# Patient Record
Sex: Male | Born: 1965
Health system: Southern US, Community
[De-identification: ages and names within clinical notes are randomized; demographics above are authoritative.]

## PROBLEM LIST (undated history)

## (undated) DIAGNOSIS — K219 Gastro-esophageal reflux disease without esophagitis: Secondary | ICD-10-CM

## (undated) DIAGNOSIS — G709 Myoneural disorder, unspecified: Secondary | ICD-10-CM

## (undated) DIAGNOSIS — C024 Malignant neoplasm of lingual tonsil: Secondary | ICD-10-CM

## (undated) DIAGNOSIS — F419 Anxiety disorder, unspecified: Secondary | ICD-10-CM

## (undated) DIAGNOSIS — F329 Major depressive disorder, single episode, unspecified: Secondary | ICD-10-CM

## (undated) DIAGNOSIS — Z4659 Encounter for fitting and adjustment of other gastrointestinal appliance and device: Secondary | ICD-10-CM

## (undated) DIAGNOSIS — I10 Essential (primary) hypertension: Secondary | ICD-10-CM

## (undated) DIAGNOSIS — K838 Other specified diseases of biliary tract: Secondary | ICD-10-CM

## (undated) DIAGNOSIS — F32A Depression, unspecified: Secondary | ICD-10-CM

## (undated) DIAGNOSIS — K8050 Calculus of bile duct without cholangitis or cholecystitis without obstruction: Secondary | ICD-10-CM

## (undated) DIAGNOSIS — R17 Unspecified jaundice: Secondary | ICD-10-CM

## (undated) HISTORY — PX: KNEE SURGERY: SHX244

## (undated) HISTORY — DX: Anxiety disorder, unspecified: F41.9

## (undated) HISTORY — DX: Other specified diseases of biliary tract: K83.8

## (undated) HISTORY — DX: Calculus of bile duct without cholangitis or cholecystitis without obstruction: K80.50

## (undated) HISTORY — DX: Unspecified jaundice: R17

## (undated) HISTORY — DX: Myoneural disorder, unspecified: G70.9

## (undated) HISTORY — DX: Encounter for fitting and adjustment of other gastrointestinal appliance and device: Z46.59

---

## 2007-06-10 ENCOUNTER — Emergency Department: Payer: Self-pay | Admitting: Emergency Medicine

## 2009-12-13 ENCOUNTER — Ambulatory Visit: Payer: Self-pay | Admitting: Internal Medicine

## 2010-01-03 ENCOUNTER — Ambulatory Visit: Payer: Self-pay | Admitting: Otolaryngology

## 2010-01-08 ENCOUNTER — Ambulatory Visit: Payer: Self-pay | Admitting: Otolaryngology

## 2010-01-08 ENCOUNTER — Ambulatory Visit: Payer: Self-pay | Admitting: Internal Medicine

## 2010-01-11 ENCOUNTER — Ambulatory Visit: Payer: Self-pay | Admitting: Oncology

## 2010-01-12 ENCOUNTER — Ambulatory Visit: Payer: Self-pay | Admitting: Internal Medicine

## 2010-01-17 ENCOUNTER — Ambulatory Visit: Payer: Self-pay | Admitting: Oncology

## 2010-01-23 ENCOUNTER — Ambulatory Visit: Payer: Self-pay | Admitting: Vascular Surgery

## 2010-02-12 ENCOUNTER — Ambulatory Visit: Payer: Self-pay | Admitting: Oncology

## 2010-02-12 ENCOUNTER — Ambulatory Visit: Payer: Self-pay | Admitting: Internal Medicine

## 2010-03-15 ENCOUNTER — Ambulatory Visit: Payer: Self-pay | Admitting: Oncology

## 2010-03-15 ENCOUNTER — Ambulatory Visit: Payer: Self-pay | Admitting: Internal Medicine

## 2010-04-04 ENCOUNTER — Ambulatory Visit: Payer: Self-pay | Admitting: Radiation Oncology

## 2010-04-14 ENCOUNTER — Ambulatory Visit: Payer: Self-pay | Admitting: Oncology

## 2010-04-14 ENCOUNTER — Ambulatory Visit: Payer: Self-pay | Admitting: Internal Medicine

## 2010-05-15 ENCOUNTER — Ambulatory Visit: Payer: Self-pay | Admitting: Oncology

## 2010-05-15 ENCOUNTER — Ambulatory Visit: Payer: Self-pay | Admitting: Internal Medicine

## 2010-06-14 ENCOUNTER — Ambulatory Visit: Payer: Self-pay | Admitting: Oncology

## 2010-06-14 ENCOUNTER — Ambulatory Visit: Payer: Self-pay | Admitting: Internal Medicine

## 2010-07-15 ENCOUNTER — Ambulatory Visit: Payer: Self-pay | Admitting: Oncology

## 2010-07-15 ENCOUNTER — Ambulatory Visit: Payer: Self-pay | Admitting: Internal Medicine

## 2010-08-07 ENCOUNTER — Ambulatory Visit: Payer: Self-pay | Admitting: Oncology

## 2010-08-15 ENCOUNTER — Ambulatory Visit: Payer: Self-pay | Admitting: Internal Medicine

## 2010-08-15 ENCOUNTER — Ambulatory Visit: Payer: Self-pay | Admitting: Oncology

## 2010-09-13 ENCOUNTER — Ambulatory Visit: Payer: Self-pay | Admitting: Oncology

## 2010-10-14 ENCOUNTER — Ambulatory Visit: Payer: Self-pay | Admitting: Oncology

## 2010-11-13 ENCOUNTER — Ambulatory Visit: Payer: Self-pay | Admitting: Oncology

## 2010-12-14 ENCOUNTER — Ambulatory Visit: Payer: Self-pay | Admitting: Oncology

## 2011-01-13 ENCOUNTER — Ambulatory Visit: Payer: Self-pay | Admitting: Oncology

## 2011-02-04 ENCOUNTER — Ambulatory Visit: Payer: Self-pay | Admitting: Oncology

## 2011-02-13 ENCOUNTER — Ambulatory Visit: Payer: Self-pay | Admitting: Oncology

## 2011-03-16 ENCOUNTER — Ambulatory Visit: Payer: Self-pay | Admitting: Oncology

## 2011-04-15 ENCOUNTER — Ambulatory Visit: Payer: Self-pay | Admitting: Oncology

## 2011-05-16 ENCOUNTER — Ambulatory Visit: Payer: Self-pay | Admitting: Oncology

## 2011-06-15 ENCOUNTER — Ambulatory Visit: Payer: Self-pay | Admitting: Oncology

## 2011-07-16 ENCOUNTER — Ambulatory Visit: Payer: Self-pay | Admitting: Oncology

## 2011-08-16 ENCOUNTER — Ambulatory Visit: Payer: Self-pay | Admitting: Oncology

## 2011-09-16 ENCOUNTER — Ambulatory Visit: Payer: Self-pay | Admitting: Oncology

## 2011-10-14 ENCOUNTER — Ambulatory Visit: Payer: Self-pay | Admitting: Oncology

## 2011-11-05 ENCOUNTER — Ambulatory Visit: Payer: Self-pay | Admitting: Otolaryngology

## 2011-11-13 ENCOUNTER — Ambulatory Visit: Payer: Self-pay | Admitting: Oncology

## 2011-12-16 ENCOUNTER — Ambulatory Visit: Payer: Self-pay | Admitting: Oncology

## 2012-01-13 ENCOUNTER — Ambulatory Visit: Payer: Self-pay | Admitting: Oncology

## 2012-02-13 ENCOUNTER — Ambulatory Visit: Payer: Self-pay | Admitting: Oncology

## 2012-03-15 ENCOUNTER — Ambulatory Visit: Payer: Self-pay | Admitting: Oncology

## 2012-04-20 ENCOUNTER — Ambulatory Visit: Payer: Self-pay | Admitting: Oncology

## 2012-05-15 ENCOUNTER — Ambulatory Visit: Payer: Self-pay | Admitting: Oncology

## 2012-06-14 ENCOUNTER — Ambulatory Visit: Payer: Self-pay | Admitting: Oncology

## 2012-06-19 ENCOUNTER — Ambulatory Visit: Payer: Self-pay | Admitting: Vascular Surgery

## 2013-08-11 ENCOUNTER — Ambulatory Visit: Payer: Self-pay | Admitting: Otolaryngology

## 2013-08-17 LAB — PATHOLOGY REPORT

## 2014-11-01 NOTE — Op Note (Signed)
PATIENT NAME:  Roy Little, Roy Little MR#:  [id number] DATE OF BIRTH:  Aug 11, 1965  DATE OF PROCEDURE:  06/19/2012  PREOPERATIVE DIAGNOSIS: Squamous cell carcinoma of the tongue.   POSTOPERATIVE DIAGNOSIS: Squamous cell carcinoma of the tongue.   PROCEDURE PERFORMED: Removal right IJ Infuse-a-Port.   SURGEON: Hortencia Pilar, MD   SEDATION: Versed 7 mg plus fentanyl 300 mcg administered IV. Continuous ECG, pulse oximetry and cardiopulmonary monitoring was performed throughout the entire procedure by the Interventional Radiology nurse. Total sedation time was 25 minutes.   ACCESS: Right IJ.   FLUOROSCOPY TIME:  None.   CONTRAST: None.   INDICATIONS: Mr. Jodoin has completed chemotherapy and is now having his port removed. The risks and benefits were reviewed. All questions were answered. The patient agrees to proceed.   DESCRIPTION OF PROCEDURE: The patient is taken to Special Procedures and placed in the supine position. After adequate sedation is achieved, 1% lidocaine was infiltrated in the soft tissues. A previous incisional scar is reopened with an 11 blade and the port is removed. Pressure is held for five minutes, and subsequently the subcutaneous tissues are reapproximated with interrupted 3-0 Vicryl and the skin is closed with 4-0 Monocryl subcuticular and Dermabond. The patient tolerated the procedure well. There were no immediate complications.  ____________________________ Katha Cabal, MD ggs:cbb D: 06/19/2012 14:43:12 ET T: 06/19/2012 15:10:53 ET JOB#: [id number] Katha Cabal MD ELECTRONICALLY SIGNED 07/01/2012 16:23

## 2014-11-05 NOTE — Op Note (Signed)
PATIENT NAME:  Roy Little, Roy Little MR#:  [id number] DATE OF BIRTH:  1966-01-04  DATE OF PROCEDURE:  08/11/2013  PREOPERATIVE  DIAGNOSES: 1.  Left laryngeal granuloma.  2.  Neoplasm left hypopharynx, posterior wall.  POSTOPERATIVE DIAGNOSES: 1.  Left laryngeal granuloma.  2.  Neoplasm left hypopharynx, posterior wall.  PROCEDURE: 1.  Microlaryngoscopy with excision of left laryngeal granuloma and Kenalog injection.  2.  CO2 laser excision of neoplasm involving the left hypopharyngeal posterior pharyngeal wall adjacent to the tongue base.   SURGEON: Malon Kindle, MD.   ANESTHESIA: General endotracheal.   INDICATIONS: This is a 49 year old male with a history of left laryngeal granuloma as well as squamous cell carcinoma involving the left tongue base and hypopharynx. He has a new lesion near the tongue base on the left side as well as recurrence of his laryngeal granuloma.   FINDINGS: There was a granuloma involving the left arytenoid and posterior vocal cord region. There was an approximately 5 to 6 mm exophytic neoplasm involving the left posterior pharyngeal wall and the hypopharynx just adjacent to the base of tongue. This lesion appeared quite superficial, was quite mobile to digital palpation.  It did not appear to infiltrate deeply.   COMPLICATIONS: None.   DESCRIPTION OF PROCEDURE: After obtaining informed consent, the patient was taken to the operating room and placed in the supine position. After induction of general endotracheal anesthesia with a laser laryngeal tube, the patient was turned 90 degrees and placed in a shoulder roll. A tooth guard was used to protect the upper teeth. The eyes were protected with saline moistened gauze pads and saline moistened towels draped around the patient's face. A Dedo laryngoscope was used to evaluate the hypopharynx, larynx and tongue base with the above findings. The laryngoscope was placed into position in the larynx visualizing the arytenoid  granuloma. The patient was placed into suspension. The lesion was inspected with a 0 degrees scope and photodocumentation obtained. The microscope was then moved into position for better visualization. The laryngeal lesion was biopsied away with cup forceps down to its base. This tissue was sent for pathology. The base of the lesion was then injected with 40 mg/mL Kenalog. Less than 0.25 mL was injected into the base of the lesion. Minor bleeding was controlled with Afrin moistened pledgets. The patient was then taken out of suspension and the scope removed to visualize the lesion in the hypopharynx on the left posterior pharyngeal wall. Bimanual palpation of the lesion revealed it was very superficial and mobile with no evidence of any deep extension. It was about 5 to 6 mm in greatest dimension and exophytic with scant exudate on its surface. It had a very friable appearance. The patient was again placed into suspension with this lesion identified. The CO2 laser was then used on a setting of 4 watts continuous to outline a margin of approximately 4 mm around the entire lesion. With the mucosa cut using the laser, the image was grasped with forceps and the lesion dissected predominantly with use of the laser, although some cold dissection was used with microlaryngeal scissors. The lesion pulled easily away from the underlying muscle. There did not appear to be any deep infiltration. Minor bleeding was controlled with Afrin moistened pledgets and spot welding with the laser. The laryngoscope was removed and the patient returned to the anesthesiologist for awakening. He was awakened and taken to the recovery room in good condition postoperatively. Blood loss was less than 10 mL.   ____________________________  Sammuel Hines. Richardson Landry, MD psb:dp D: 08/11/2013 11:45:38 ET T: 08/11/2013 12:03:06 ET JOB#: [id number]  cc: Sammuel Hines. Richardson Landry, MD, <Dictator> Riley Nearing MD ELECTRONICALLY SIGNED 08/18/2013 16:55

## 2016-11-25 DIAGNOSIS — K219 Gastro-esophageal reflux disease without esophagitis: Secondary | ICD-10-CM | POA: Diagnosis not present

## 2016-11-25 DIAGNOSIS — G4700 Insomnia, unspecified: Secondary | ICD-10-CM | POA: Diagnosis not present

## 2017-06-05 ENCOUNTER — Observation Stay
Admission: EM | Admit: 2017-06-05 | Discharge: 2017-06-08 | Disposition: A | Discharge status: Home / Self Care | Payer: 59 | Attending: Surgery | Admitting: Surgery

## 2017-06-05 ENCOUNTER — Emergency Department: Payer: 59

## 2017-06-05 ENCOUNTER — Encounter: Payer: Self-pay | Admitting: Emergency Medicine

## 2017-06-05 DIAGNOSIS — K819 Cholecystitis, unspecified: Secondary | ICD-10-CM

## 2017-06-05 DIAGNOSIS — Z79899 Other long term (current) drug therapy: Secondary | ICD-10-CM | POA: Diagnosis not present

## 2017-06-05 DIAGNOSIS — J9811 Atelectasis: Secondary | ICD-10-CM | POA: Diagnosis not present

## 2017-06-05 DIAGNOSIS — I10 Essential (primary) hypertension: Secondary | ICD-10-CM | POA: Diagnosis not present

## 2017-06-05 DIAGNOSIS — Z419 Encounter for procedure for purposes other than remedying health state, unspecified: Secondary | ICD-10-CM

## 2017-06-05 DIAGNOSIS — R1013 Epigastric pain: Secondary | ICD-10-CM

## 2017-06-05 DIAGNOSIS — K811 Chronic cholecystitis: Principal | ICD-10-CM | POA: Insufficient documentation

## 2017-06-05 DIAGNOSIS — R1084 Generalized abdominal pain: Secondary | ICD-10-CM | POA: Diagnosis not present

## 2017-06-05 DIAGNOSIS — Z85810 Personal history of malignant neoplasm of tongue: Secondary | ICD-10-CM | POA: Diagnosis not present

## 2017-06-05 DIAGNOSIS — K8000 Calculus of gallbladder with acute cholecystitis without obstruction: Secondary | ICD-10-CM | POA: Diagnosis present

## 2017-06-05 DIAGNOSIS — R1110 Vomiting, unspecified: Secondary | ICD-10-CM | POA: Diagnosis not present

## 2017-06-05 HISTORY — DX: Malignant neoplasm of lingual tonsil: C02.4

## 2017-06-05 LAB — CBC
HCT: 50.5 % (ref 40.0–52.0)
Hemoglobin: 17.2 g/dL (ref 13.0–18.0)
MCH: 29.9 pg (ref 26.0–34.0)
MCHC: 34.1 g/dL (ref 32.0–36.0)
MCV: 87.5 fL (ref 80.0–100.0)
Platelets: 368 K/uL (ref 150–440)
RBC: 5.77 MIL/uL (ref 4.40–5.90)
RDW: 13.5 % (ref 11.5–14.5)
WBC: 17.3 K/uL — ABNORMAL HIGH (ref 3.8–10.6)

## 2017-06-05 LAB — LIPASE, BLOOD: Lipase: 31 U/L (ref 11–51)

## 2017-06-05 LAB — COMPREHENSIVE METABOLIC PANEL WITH GFR
ALT: 100 U/L — ABNORMAL HIGH (ref 17–63)
AST: 134 U/L — ABNORMAL HIGH (ref 15–41)
Albumin: 4.8 g/dL (ref 3.5–5.0)
Alkaline Phosphatase: 131 U/L — ABNORMAL HIGH (ref 38–126)
Anion gap: 12 (ref 5–15)
BUN: 17 mg/dL (ref 6–20)
CO2: 27 mmol/L (ref 22–32)
Calcium: 9.1 mg/dL (ref 8.9–10.3)
Chloride: 101 mmol/L (ref 101–111)
Creatinine, Ser: 0.91 mg/dL (ref 0.61–1.24)
GFR calc Af Amer: >60 mL/min
GFR calc non Af Amer: >60 mL/min
Glucose, Bld: 143 mg/dL — ABNORMAL HIGH (ref 65–99)
Potassium: 3.5 mmol/L (ref 3.5–5.1)
Sodium: 140 mmol/L (ref 135–145)
Total Bilirubin: 1.2 mg/dL (ref 0.3–1.2)
Total Protein: 7.9 g/dL (ref 6.5–8.1)

## 2017-06-05 MED ORDER — MORPHINE SULFATE (PF) 4 MG/ML IV SOLN
INTRAVENOUS | Status: AC
  Filled 2017-06-05: qty 1

## 2017-06-05 MED ORDER — IOPAMIDOL (ISOVUE-300) INJECTION 61%
30.0000 mL | Freq: Once | INTRAVENOUS | Status: AC | PRN
  Administered 2017-06-06: 30 mL via ORAL

## 2017-06-05 MED ORDER — MORPHINE SULFATE (PF) 4 MG/ML IV SOLN
INTRAVENOUS | Status: AC
  Administered 2017-06-05: 4 mg via INTRAVENOUS
  Filled 2017-06-05: qty 1

## 2017-06-05 MED ORDER — SODIUM CHLORIDE 0.9 % IV BOLUS (SEPSIS)
1000.0000 mL | Freq: Once | INTRAVENOUS | Status: AC
  Administered 2017-06-05: 1000 mL via INTRAVENOUS

## 2017-06-05 MED ORDER — MORPHINE SULFATE (PF) 4 MG/ML IV SOLN
4.0000 mg | Freq: Once | INTRAVENOUS | Status: AC
  Administered 2017-06-05: 4 mg via INTRAVENOUS

## 2017-06-05 MED ORDER — ONDANSETRON HCL 4 MG/2ML IJ SOLN
4.0000 mg | Freq: Once | INTRAMUSCULAR | Status: AC | PRN
  Administered 2017-06-05: 4 mg via INTRAVENOUS
  Filled 2017-06-05: qty 2

## 2017-06-05 NOTE — ED Triage Notes (Signed)
Pt arrived with family with complaints of severe abdominal pain that started at 1700 today. Pt reports 6 episodes of vomiting. Pt in extreme pain upon triage and is short of breath.Pt states pain is in his upper abdomen.

## 2017-06-05 NOTE — ED Provider Notes (Signed)
[facility] Emergency Department Provider Note ____________________________________________   First MD Initiated Contact with Patient 06/05/17 2056     (approximate)  I have reviewed the triage vital signs and the nursing notes.   HISTORY  Chief Complaint Abdominal Pain    HPI Roy Little is a 51 y.o. male with a history of lingual tonsil malignancy which is in remission, who presents with epigastric and bilateral upper abdominal pain, acute onset approximately 4 hours ago, occurring approximately 2 hours after he ate Thanksgiving dinner, constant, and associated with approximately 6 episodes of nonbloody vomiting.   Patient denies any prior history of similar pain.  He has no history of any abdominal surgeries.  He states his last bowel movement was earlier today prior to eating and it was normal.  No sick contacts or other recent illness.  Denies any specific chest or back pain.  No shortness of breath, but he states that the abdominal pain is worse when he takes deep breath.  Past Medical History:  Diagnosis Date  . 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[place])     There are no active problems to display for this patient.   History reviewed. No pertinent surgical history.  Prior to Admission medications   Not on File    Allergies Patient has no known allergies.  No family history on file.  Social History Social History   Tobacco Use  . Smoking status: Never Smoker  . Smokeless tobacco: Never Used  Substance Use Topics  . Alcohol use: Yes  . Drug use: No    Review of Systems  Constitutional: No fever/chills. Eyes: No numbness. ENT: No sore throat. Cardiovascular: Denies chest pain. Respiratory: Denies shortness of breath. Gastrointestinal: Positive for nausea, vomiting, and abdominal pain..  Genitourinary: Negative for dysuria.  Musculoskeletal: Negative for back pain. Skin: Negative for rash. Neurological: Negative for  headache.   ____________________________________________   PHYSICAL EXAM:  VITAL SIGNS: ED Triage Vitals  Enc Vitals Group     BP 06/05/17 2052 (!) 205/127     Pulse Rate 06/05/17 2052 (!) 130     Resp 06/05/17 2052 (!) 25     Temp 06/05/17 2052 98.1 F (36.7 C)     Temp Source 06/05/17 2052 Oral     SpO2 06/05/17 2052 97 %     Weight 06/05/17 2049 195 lb (88.5 kg)     Height 06/05/17 2049 5\' 9"  (1.753 m)     Head Circumference --      Peak Flow --      Pain Score 06/05/17 2049 10     Pain Loc --      Pain Edu? --      Excl. in [place]? --     Constitutional: Alert and oriented.  Uncomfortable appearing and slightly diaphoretic. Eyes: Conjunctivae are normal.  No scleral icterus. Head: Atraumatic. Nose: No congestion/rhinnorhea. Mouth/Throat: Mucous membranes are moist.   Neck: Normal range of motion.  Cardiovascular: Tachycardic, regular rhythm. Grossly normal heart sounds.  Good peripheral circulation. Respiratory: Normal respiratory effort.  No retractions. Lungs CTAB. Gastrointestinal: Soft with mild bilateral upper abdominal tenderness.  Periumbilical area and lower abdomen are nontender.  No peritoneal signs. Genitourinary: No CVA tenderness. Musculoskeletal: No lower extremity edema.  Extremities warm and well perfused.  Neurologic:  Normal speech and language. No gross focal neurologic deficits are appreciated.  Skin:  Skin is warm and dry. No rash noted. Psychiatric: Mood and affect are normal. Speech and behavior are normal.  ____________________________________________   LABS (all labs ordered are listed, but only abnormal results are displayed)  Labs Reviewed  COMPREHENSIVE METABOLIC PANEL - Abnormal; Notable for the following components:      Result Value   Glucose, Bld 143 (*)    AST 134 (*)    ALT 100 (*)    Alkaline Phosphatase 131 (*)    All other components within normal limits  CBC - Abnormal; Notable for the following components:   WBC 17.3 (*)     All other components within normal limits  LIPASE, BLOOD  URINALYSIS, COMPLETE (UACMP) WITH MICROSCOPIC  TROPONIN I   ____________________________________________  EKG  ED ECG REPORT I, Arta Silence, the attending physician, personally viewed and interpreted this ECG.  Date: 06/05/2017 EKG Time: 2050 Rate: 133 Rhythm: Sinus tachycardia QRS Axis: normal Intervals: normal ST/T Wave abnormalities: normal Narrative Interpretation: Sinus tachycardia with no evidence of acute ischemia  ____________________________________________  RADIOLOGY  CXR: no free air [place] abd: no gallstones or evidence of cholecystitis.   CT abd: pending ____________________________________________   PROCEDURES  Procedure(s) performed: No    Critical Care performed: No ____________________________________________   INITIAL IMPRESSION / ASSESSMENT AND PLAN / ED COURSE  Pertinent labs & imaging results that were available during my care of the patient were reviewed by me and considered in my medical decision making (see chart for details).  51 year old male with a history of a lingual cancer but no other significant medical problems and no prior abdominal surgeries presents with severe acute onset epigastric abdominal pain within a few hours after eating Thanksgiving meal.  Is associated with multiple episodes of vomiting, but no other acute symptoms.  Review of past medical records in Epic is noncontributory.  On exam, patient is extremely uncomfortable appearing, and he is tachycardic and hypertensive; I suspect that these vital sign abnormalities are most likely related to acute pain.  He has tenderness to bilateral upper quadrants in the epigastric area, but the remainder of the abdomen is nontender.  Otherwise his exam is unremarkable.  Differential includes biliary colic or acute cholecystitis, pancreatitis (although less likely because patient denies alcohol use), gastritis, PUD with  possible perforation, less likely other hepatobiliary cause, or colitis.  Consider small bowel obstruction or volvulus, however patient has no specific risk factors for this.  Although patient is tachycardic and hypertensive, given the location of the pain entirely the abdomen and not going to the chest or back, as well as his lack of vascular risk factors and normal EKG, I have a low suspicion for aortic dissection or other acute vascular cause at this time.  Plan: Analgesia, fluids, basic and hepatobiliary labs, and chest x-ray to rule out free air.  We will then proceed to right upper quadrant ultrasound.  If this is negative I will obtain a CT of the abdomen.    ----------------------------------------- 12:15 AM on 06/06/2017 -----------------------------------------  Patient's lab workup is unremarkable except for elevated WBC count which is nonspecific.  Chest x-ray did not reveal free air, and the ultrasound did not reveal gallstones or evidence of cholecystitis.  Patient reports significantly improved pain, and appears much more comfortable.  On my reassessment his systolic blood pressure was in the 170s and his heart rate was improved as well.  Given patient's improved appearance, improved vital signs, and the negative workup so far, I still do not have a significant clinical suspicion for vascular etiology.  As planned initially, we will proceed with a CT abdomen to evaluate for  colitis, obstruction, or other acute causes.  Patient signed out to oncoming provider Dr. Dahlia Client.  If his vital signs normalized, he remains comfortable, and the CT is negative for acute findings, he may be discharged home.  I explained the plan to the patient and he expressed understanding.  ____________________________________________   FINAL CLINICAL IMPRESSION(S) / ED DIAGNOSES  Final diagnoses:  Epigastric abdominal pain      NEW MEDICATIONS STARTED DURING THIS VISIT:  This SmartLink is deprecated.  Use AVSMEDLIST instead to display the medication list for a patient.   Note:  This document was prepared using Dragon voice recognition software and may include unintentional dictation errors.    Arta Silence, MD 06/06/17 [phone]

## 2017-06-05 NOTE — ED Notes (Signed)
First Nurse Note: Pt here with complaints of vomiting. Pt appears pale.

## 2017-06-06 ENCOUNTER — Observation Stay: Payer: 59

## 2017-06-06 ENCOUNTER — Observation Stay: Payer: 59 | Admitting: Certified Registered Nurse Anesthetist

## 2017-06-06 ENCOUNTER — Other Ambulatory Visit: Payer: Self-pay

## 2017-06-06 ENCOUNTER — Encounter
Admission: EM | Disposition: A | Discharge status: Home / Self Care | Payer: Self-pay | Source: Home / Self Care | Attending: Emergency Medicine

## 2017-06-06 ENCOUNTER — Emergency Department: Payer: 59

## 2017-06-06 DIAGNOSIS — K811 Chronic cholecystitis: Secondary | ICD-10-CM | POA: Diagnosis not present

## 2017-06-06 DIAGNOSIS — K810 Acute cholecystitis: Secondary | ICD-10-CM | POA: Diagnosis not present

## 2017-06-06 DIAGNOSIS — K8000 Calculus of gallbladder with acute cholecystitis without obstruction: Secondary | ICD-10-CM | POA: Diagnosis not present

## 2017-06-06 DIAGNOSIS — Z79899 Other long term (current) drug therapy: Secondary | ICD-10-CM | POA: Diagnosis not present

## 2017-06-06 DIAGNOSIS — R945 Abnormal results of liver function studies: Secondary | ICD-10-CM | POA: Diagnosis not present

## 2017-06-06 DIAGNOSIS — I10 Essential (primary) hypertension: Secondary | ICD-10-CM | POA: Diagnosis not present

## 2017-06-06 DIAGNOSIS — K8020 Calculus of gallbladder without cholecystitis without obstruction: Secondary | ICD-10-CM | POA: Diagnosis not present

## 2017-06-06 DIAGNOSIS — R1110 Vomiting, unspecified: Secondary | ICD-10-CM | POA: Diagnosis not present

## 2017-06-06 DIAGNOSIS — K819 Cholecystitis, unspecified: Secondary | ICD-10-CM | POA: Diagnosis not present

## 2017-06-06 HISTORY — PX: CHOLECYSTECTOMY: SHX55

## 2017-06-06 HISTORY — DX: Calculus of gallbladder with acute cholecystitis without obstruction: K80.00

## 2017-06-06 LAB — URINALYSIS, COMPLETE (UACMP) WITH MICROSCOPIC
Bacteria, UA: NONE SEEN
Bilirubin Urine: NEGATIVE
GLUCOSE, UA: NEGATIVE mg/dL
HGB URINE DIPSTICK: NEGATIVE
KETONES UR: NEGATIVE mg/dL
Leukocytes, UA: NEGATIVE
NITRITE: NEGATIVE
PH: 7 (ref 5.0–8.0)
Protein, ur: 30 mg/dL — AB
Specific Gravity, Urine: 1.011 (ref 1.005–1.030)
Squamous Epithelial / LPF: NONE SEEN

## 2017-06-06 LAB — LACTIC ACID, PLASMA
Lactic Acid, Venous: 2.2 mmol/L (ref 0.5–1.9)
Lactic Acid, Venous: 1.7 mmol/L (ref 0.5–1.9)

## 2017-06-06 LAB — TROPONIN I: Troponin I: <0.03 ng/mL (ref <0.03)

## 2017-06-06 SURGERY — LAPAROSCOPIC CHOLECYSTECTOMY WITH INTRAOPERATIVE CHOLANGIOGRAM
Anesthesia: General | Site: Abdomen | Wound class: Clean

## 2017-06-06 MED ORDER — MORPHINE SULFATE (PF) 2 MG/ML IV SOLN
2.0000 mg | INTRAVENOUS | Status: DC | PRN
  Administered 2017-06-06: 2 mg via INTRAVENOUS
  Filled 2017-06-06: qty 1

## 2017-06-06 MED ORDER — BUPIVACAINE-EPINEPHRINE (PF) 0.25% -1:200000 IJ SOLN
INTRAMUSCULAR | Status: AC
  Filled 2017-06-06: qty 30

## 2017-06-06 MED ORDER — ACETAMINOPHEN 650 MG RE SUPP: 650.0000 mg | Freq: Four times a day (QID) | RECTAL | Status: DC | PRN

## 2017-06-06 MED ORDER — PANTOPRAZOLE SODIUM 40 MG IV SOLR
40.0000 mg | Freq: Every day | INTRAVENOUS | Status: DC
  Administered 2017-06-06 – 2017-06-07 (×2): 40 mg via INTRAVENOUS
  Filled 2017-06-06 (×2): qty 40

## 2017-06-06 MED ORDER — PHENYLEPHRINE HCL 10 MG/ML IJ SOLN
INTRAMUSCULAR | Status: DC | PRN
  Administered 2017-06-06: 200 ug via INTRAVENOUS
  Administered 2017-06-06 (×2): 150 ug via INTRAVENOUS
  Administered 2017-06-06 (×3): 100 ug via INTRAVENOUS
  Administered 2017-06-06: 150 ug via INTRAVENOUS

## 2017-06-06 MED ORDER — ACETAMINOPHEN 10 MG/ML IV SOLN
INTRAVENOUS | Status: AC
  Filled 2017-06-06: qty 100

## 2017-06-06 MED ORDER — FENTANYL CITRATE (PF) 100 MCG/2ML IJ SOLN
25.0000 ug | INTRAMUSCULAR | Status: DC | PRN
  Administered 2017-06-06: 25 ug via INTRAVENOUS

## 2017-06-06 MED ORDER — METOPROLOL TARTRATE 25 MG PO TABS
25.0000 mg | ORAL_TABLET | Freq: Two times a day (BID) | ORAL | Status: DC
  Administered 2017-06-06 – 2017-06-08 (×4): 25 mg via ORAL
  Filled 2017-06-06 (×4): qty 1

## 2017-06-06 MED ORDER — DEXAMETHASONE SODIUM PHOSPHATE 10 MG/ML IJ SOLN
INTRAMUSCULAR | Status: DC | PRN
  Administered 2017-06-06: 10 mg via INTRAVENOUS

## 2017-06-06 MED ORDER — VANCOMYCIN HCL IN DEXTROSE 1-5 GM/200ML-% IV SOLN
1000.0000 mg | Freq: Three times a day (TID) | INTRAVENOUS | Status: DC
  Filled 2017-06-06 (×2): qty 200

## 2017-06-06 MED ORDER — FENTANYL CITRATE (PF) 100 MCG/2ML IJ SOLN
INTRAMUSCULAR | Status: AC
  Filled 2017-06-06: qty 2

## 2017-06-06 MED ORDER — LABETALOL HCL 5 MG/ML IV SOLN: 10.0000 mg | Freq: Once | INTRAVENOUS | Status: DC

## 2017-06-06 MED ORDER — PROPOFOL 10 MG/ML IV BOLUS
INTRAVENOUS | Status: AC
  Filled 2017-06-06: qty 20

## 2017-06-06 MED ORDER — ONDANSETRON HCL 4 MG/2ML IJ SOLN: 4.0000 mg | Freq: Once | INTRAMUSCULAR | Status: DC | PRN

## 2017-06-06 MED ORDER — IOPAMIDOL (ISOVUE-300) INJECTION 61%
100.0000 mL | Freq: Once | INTRAVENOUS | Status: AC | PRN
  Administered 2017-06-06: 100 mL via INTRAVENOUS

## 2017-06-06 MED ORDER — ONDANSETRON HCL 4 MG/2ML IJ SOLN
INTRAMUSCULAR | Status: AC
  Filled 2017-06-06: qty 2

## 2017-06-06 MED ORDER — VANCOMYCIN HCL IN DEXTROSE 1-5 GM/200ML-% IV SOLN
1000.0000 mg | Freq: Once | INTRAVENOUS | Status: AC
  Administered 2017-06-06: 1000 mg via INTRAVENOUS
  Filled 2017-06-06: qty 200

## 2017-06-06 MED ORDER — DIPHENHYDRAMINE HCL 50 MG/ML IJ SOLN
INTRAMUSCULAR | Status: DC | PRN
  Administered 2017-06-06: 12.5 mg via INTRAVENOUS

## 2017-06-06 MED ORDER — DEXTROSE-NACL 5-0.45 % IV SOLN
INTRAVENOUS | Status: DC
Start: 2017-06-06 — End: 2017-06-07
  Administered 2017-06-06 – 2017-06-07 (×3): via INTRAVENOUS

## 2017-06-06 MED ORDER — HYDROCODONE-ACETAMINOPHEN 5-325 MG PO TABS
1.0000 | ORAL_TABLET | ORAL | Status: DC | PRN
  Administered 2017-06-06: 1 via ORAL
  Administered 2017-06-07 – 2017-06-08 (×3): 2 via ORAL
  Filled 2017-06-06 (×3): qty 2
  Filled 2017-06-06: qty 1

## 2017-06-06 MED ORDER — PROPOFOL 10 MG/ML IV BOLUS
INTRAVENOUS | Status: DC | PRN
  Administered 2017-06-06: 30 mg via INTRAVENOUS
  Administered 2017-06-06: 170 mg via INTRAVENOUS

## 2017-06-06 MED ORDER — EPHEDRINE SULFATE 50 MG/ML IJ SOLN
INTRAMUSCULAR | Status: DC | PRN
  Administered 2017-06-06: 5 mg via INTRAVENOUS
  Administered 2017-06-06: 10 mg via INTRAVENOUS

## 2017-06-06 MED ORDER — ROCURONIUM BROMIDE 100 MG/10ML IV SOLN
INTRAVENOUS | Status: DC | PRN
  Administered 2017-06-06: 20 mg via INTRAVENOUS
  Administered 2017-06-06: 30 mg via INTRAVENOUS
  Administered 2017-06-06: 10 mg via INTRAVENOUS

## 2017-06-06 MED ORDER — SUCCINYLCHOLINE CHLORIDE 20 MG/ML IJ SOLN
INTRAMUSCULAR | Status: DC | PRN
  Administered 2017-06-06: 120 mg via INTRAVENOUS

## 2017-06-06 MED ORDER — SODIUM CHLORIDE 0.9 % IV BOLUS (SEPSIS): 1000.0000 mL | Freq: Once | INTRAVENOUS | Status: DC

## 2017-06-06 MED ORDER — FENTANYL CITRATE (PF) 100 MCG/2ML IJ SOLN
INTRAMUSCULAR | Status: DC | PRN
  Administered 2017-06-06 (×2): 50 ug via INTRAVENOUS

## 2017-06-06 MED ORDER — ACETAMINOPHEN 325 MG PO TABS: 650.0000 mg | ORAL_TABLET | Freq: Four times a day (QID) | ORAL | Status: DC | PRN

## 2017-06-06 MED ORDER — LIDOCAINE HCL (CARDIAC) 20 MG/ML IV SOLN
INTRAVENOUS | Status: DC | PRN
Start: 2017-06-06 — End: 2017-06-06
  Administered 2017-06-06: 100 mg via INTRAVENOUS

## 2017-06-06 MED ORDER — SUGAMMADEX SODIUM 200 MG/2ML IV SOLN
INTRAVENOUS | Status: DC | PRN
  Administered 2017-06-06: 174.4 mg via INTRAVENOUS

## 2017-06-06 MED ORDER — PROMETHAZINE HCL 25 MG/ML IJ SOLN
12.5000 mg | Freq: Four times a day (QID) | INTRAMUSCULAR | Status: DC | PRN
  Administered 2017-06-06: 12.5 mg via INTRAVENOUS
  Filled 2017-06-06: qty 1

## 2017-06-06 MED ORDER — DEXAMETHASONE SODIUM PHOSPHATE 10 MG/ML IJ SOLN
INTRAMUSCULAR | Status: AC
  Filled 2017-06-06: qty 1

## 2017-06-06 MED ORDER — SUGAMMADEX SODIUM 200 MG/2ML IV SOLN
INTRAVENOUS | Status: AC
  Filled 2017-06-06: qty 2

## 2017-06-06 MED ORDER — HYDRALAZINE HCL 20 MG/ML IJ SOLN
10.0000 mg | INTRAMUSCULAR | Status: DC | PRN
  Administered 2017-06-06 – 2017-06-07 (×3): 10 mg via INTRAVENOUS
  Filled 2017-06-06 (×4): qty 1

## 2017-06-06 MED ORDER — LABETALOL HCL 5 MG/ML IV SOLN
10.0000 mg | Freq: Four times a day (QID) | INTRAVENOUS | Status: DC
  Administered 2017-06-06: 10 mg via INTRAVENOUS
  Filled 2017-06-06: qty 4

## 2017-06-06 MED ORDER — ENALAPRILAT 1.25 MG/ML IV SOLN: 1.2500 mg | Freq: Three times a day (TID) | INTRAVENOUS | Status: DC

## 2017-06-06 MED ORDER — LABETALOL HCL 5 MG/ML IV SOLN
20.0000 mg | Freq: Once | INTRAVENOUS | Status: AC
  Administered 2017-06-06: 20 mg via INTRAVENOUS
  Filled 2017-06-06: qty 4

## 2017-06-06 MED ORDER — ONDANSETRON HCL 4 MG/2ML IJ SOLN
4.0000 mg | Freq: Four times a day (QID) | INTRAMUSCULAR | Status: DC
  Administered 2017-06-06 – 2017-06-08 (×5): 4 mg via INTRAVENOUS
  Filled 2017-06-06 (×4): qty 2

## 2017-06-06 MED ORDER — ONDANSETRON HCL 4 MG/2ML IJ SOLN
4.0000 mg | Freq: Once | INTRAMUSCULAR | Status: AC | PRN
  Administered 2017-06-06: 4 mg via INTRAVENOUS
  Filled 2017-06-06: qty 2

## 2017-06-06 MED ORDER — CEFTRIAXONE SODIUM 2 G IJ SOLR: 2.0000 g | INTRAMUSCULAR | Status: DC

## 2017-06-06 MED ORDER — KETOROLAC TROMETHAMINE 30 MG/ML IJ SOLN
INTRAMUSCULAR | Status: DC | PRN
  Administered 2017-06-06: 30 mg via INTRAVENOUS

## 2017-06-06 MED ORDER — KETOROLAC TROMETHAMINE 30 MG/ML IJ SOLN
INTRAMUSCULAR | Status: AC
  Filled 2017-06-06: qty 1

## 2017-06-06 MED ORDER — ACETAMINOPHEN 10 MG/ML IV SOLN
INTRAVENOUS | Status: DC | PRN
  Administered 2017-06-06: 1000 mg via INTRAVENOUS

## 2017-06-06 MED ORDER — SODIUM CHLORIDE 0.9 % IV BOLUS (SEPSIS)
1000.0000 mL | Freq: Once | INTRAVENOUS | Status: AC
  Administered 2017-06-06: 1000 mL via INTRAVENOUS

## 2017-06-06 MED ORDER — MORPHINE SULFATE (PF) 2 MG/ML IV SOLN: 2.0000 mg | INTRAVENOUS | Status: DC | PRN

## 2017-06-06 MED ORDER — MIDAZOLAM HCL 2 MG/2ML IJ SOLN
INTRAMUSCULAR | Status: AC
  Filled 2017-06-06: qty 2

## 2017-06-06 MED ORDER — MIDAZOLAM HCL 2 MG/2ML IJ SOLN
INTRAMUSCULAR | Status: DC | PRN
  Administered 2017-06-06: 2 mg via INTRAVENOUS

## 2017-06-06 MED ORDER — LACTATED RINGERS IV SOLN
INTRAVENOUS | Status: DC | PRN
  Administered 2017-06-06: 15:00:00 via INTRAVENOUS

## 2017-06-06 MED ORDER — PIPERACILLIN-TAZOBACTAM 3.375 G IVPB
3.3750 g | Freq: Three times a day (TID) | INTRAVENOUS | Status: DC
  Administered 2017-06-06 – 2017-06-08 (×7): 3.375 g via INTRAVENOUS
  Filled 2017-06-06 (×7): qty 50

## 2017-06-06 SURGICAL SUPPLY — 47 items
APPLICATOR COTTON TIP 6IN STRL (MISCELLANEOUS) ×2 IMPLANT
APPLIER CLIP 5 13 M/L LIGAMAX5 (MISCELLANEOUS) ×2
BLADE SURG 15 STRL LF DISP TIS (BLADE) ×1 IMPLANT
BLADE SURG 15 STRL SS (BLADE) ×1
BULB RESERV EVAC DRAIN JP 100C (MISCELLANEOUS) ×2 IMPLANT
CANISTER SUCT 1200ML W/VALVE (MISCELLANEOUS) ×2 IMPLANT
CHLORAPREP W/TINT 26ML (MISCELLANEOUS) ×2 IMPLANT
CHOLANGIOGRAM CATH TAUT (CATHETERS) IMPLANT
CLEANER CAUTERY TIP 5X5 PAD (MISCELLANEOUS) ×1 IMPLANT
CLIP APPLIE 5 13 M/L LIGAMAX5 (MISCELLANEOUS) ×1 IMPLANT
DECANTER SPIKE VIAL GLASS SM (MISCELLANEOUS) ×4 IMPLANT
DERMABOND ADVANCED (GAUZE/BANDAGES/DRESSINGS) ×1
DERMABOND ADVANCED .7 DNX12 (GAUZE/BANDAGES/DRESSINGS) ×1 IMPLANT
DRAIN CHANNEL JP 19F (MISCELLANEOUS) ×2 IMPLANT
DRAPE C-ARM XRAY 36X54 (DRAPES) ×2 IMPLANT
ELECT CAUTERY BLADE 6.4 (BLADE) ×2 IMPLANT
ELECT REM PT RETURN 9FT ADLT (ELECTROSURGICAL) ×2
ELECTRODE REM PT RTRN 9FT ADLT (ELECTROSURGICAL) ×1 IMPLANT
GLOVE BIO SURGEON STRL SZ7 (GLOVE) ×2 IMPLANT
GOWN STRL REUS W/ TWL LRG LVL3 (GOWN DISPOSABLE) ×3 IMPLANT
GOWN STRL REUS W/TWL LRG LVL3 (GOWN DISPOSABLE) ×3
IRRIGATION STRYKERFLOW (MISCELLANEOUS) ×1 IMPLANT
IRRIGATOR STRYKERFLOW (MISCELLANEOUS) ×2
IV CATH ANGIO 12GX3 LT BLUE (NEEDLE) ×2 IMPLANT
IV NS 1000ML (IV SOLUTION) ×1
IV NS 1000ML BAXH (IV SOLUTION) ×1 IMPLANT
L-HOOK LAP DISP 36CM (ELECTROSURGICAL) ×2
LHOOK LAP DISP 36CM (ELECTROSURGICAL) ×1 IMPLANT
NEEDLE HYPO 22GX1.5 SAFETY (NEEDLE) ×2 IMPLANT
PACK LAP CHOLECYSTECTOMY (MISCELLANEOUS) ×2 IMPLANT
PAD CLEANER CAUTERY TIP 5X5 (MISCELLANEOUS) ×1
PENCIL ELECTRO HAND CTR (MISCELLANEOUS) ×2 IMPLANT
POUCH SPECIMEN RETRIEVAL 10MM (ENDOMECHANICALS) ×2 IMPLANT
SCISSORS METZENBAUM CVD 33 (INSTRUMENTS) ×2 IMPLANT
SLEEVE ENDOPATH XCEL 5M (ENDOMECHANICALS) ×4 IMPLANT
SOL ANTI-FOG 6CC FOG-OUT (MISCELLANEOUS) ×1 IMPLANT
SOL FOG-OUT ANTI-FOG 6CC (MISCELLANEOUS) ×1
SPONGE LAP 18X18 5 PK (GAUZE/BANDAGES/DRESSINGS) ×2 IMPLANT
STOPCOCK 4 WAY LG BORE MALE ST (IV SETS) IMPLANT
SUT ETHIBOND 0 MO6 C/R (SUTURE) IMPLANT
SUT MNCRL AB 4-0 PS2 18 (SUTURE) ×2 IMPLANT
SUT VICRYL 0 AB UR-6 (SUTURE) ×4 IMPLANT
SYR 20CC LL (SYRINGE) ×2 IMPLANT
TROCAR XCEL BLUNT TIP 100MML (ENDOMECHANICALS) ×2 IMPLANT
TROCAR XCEL NON-BLD 5MMX100MML (ENDOMECHANICALS) ×2 IMPLANT
TUBING INSUFFLATION (TUBING) ×2 IMPLANT
WATER STERILE IRR 1000ML POUR (IV SOLUTION) ×2 IMPLANT

## 2017-06-06 NOTE — Consult Note (Addendum)
[facility] HOSPITALIST  Medical Consultation  DONN ZANETTI ZDG:[id number] DOB: [birth year] DOA: 06/05/2017 PCP: Patient, No Pcp Per   Requesting physician: Christene Lye, MD Date of consultation: 06/06/2017 Reason for consultation: Elevated blood pressure  CHIEF COMPLAINT:   Chief Complaint  Patient presents with  . Abdominal Pain    HISTORY OF PRESENT ILLNESS: Dov Dill  is a 51 y.o. male with a known history of lingual tonsil cancer presented to the emergency room with abdominal pain, nausea and vomiting since yesterday 5 PM. The pain was located in the epigastrium and right upper quadrant area and was sharp in nature. Pain was initially 7 out of 10 on a scale of 1-10. No complaints of any chest pain, shortness of breath. No fever and chills. Patient had Thanksgiving meal yesterday after which she started developing the abdominal discomfort in the epigastric and right upper quadrant area. Patient has history of high blood pressure but not taking any medication. Has not been seeing any primary care physician. Patient was worked up with CT abdomen which showed cholelithiasis and gallbladder wall thickening representing cholecystitis. He was evaluated and seen by surgical attending in the emergency room after consultation by ER physician and admitted under surgical service. Blood pressure has been consistently been elevated after patient presented to the emergency room and hospitalist service was consulted. Abdominal pain has improved in the emergency room after pain medication was given.  PAST MEDICAL HISTORY:   Past Medical History:  Diagnosis Date  . 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[place])     PAST SURGICAL HISTORY:  Past Surgical History:  Procedure Laterality Date  . KNEE SURGERY      SOCIAL HISTORY:  Social History   Tobacco Use  . Smoking status: Never Smoker  . Smokeless tobacco: Never Used  Substance Use Topics  . Alcohol use: Yes    FAMILY HISTORY:  Family History   Problem Relation Age of Onset  . Hypertension Mother   . Pancreatic cancer Father     DRUG ALLERGIES: No Known Allergies  REVIEW OF SYSTEMS:   CONSTITUTIONAL: No fever, fatigue or weakness.  EYES: No blurred or double vision.  EARS, NOSE, AND THROAT: No tinnitus or ear pain.  RESPIRATORY: No cough, shortness of breath, wheezing or hemoptysis.  CARDIOVASCULAR: No chest pain, orthopnea, edema.  GASTROINTESTINAL: Has nausea, vomiting,  abdominal pain.  No diarrhea noted. GENITOURINARY: No dysuria, hematuria.  ENDOCRINE: No polyuria, nocturia,  HEMATOLOGY: No anemia, easy bruising or bleeding SKIN: No rash or lesion. MUSCULOSKELETAL: No joint pain or arthritis.   NEUROLOGIC: No tingling, numbness, weakness.  PSYCHIATRY: No anxiety or depression.   MEDICATIONS AT HOME:  Prior to Admission medications   Medication Sig Start Date End Date Taking? Authorizing Provider  finasteride (PROSCAR) 5 MG tablet Take 1 tablet by mouth daily. 04/29/17  Yes [provider]      PHYSICAL EXAMINATION:   VITAL SIGNS: Blood pressure (!) 186/118, pulse (!) 104, temperature 98.1 F (36.7 C), temperature source Oral, resp. rate 18, height 5\' 9"  (1.753 m), weight 88.5 kg (195 lb), SpO2 96 %.  GENERAL:  52 y.o.-year-old patient lying in the bed with no acute distress.  EYES: Pupils equal, round, reactive to light and accommodation. No scleral icterus. Extraocular muscles intact.  HEENT: Head atraumatic, normocephalic. Oropharynx dry and nasopharynx clear.  NECK:  Supple, no jugular venous distention. No thyroid enlargement, no tenderness.  LUNGS: Normal breath sounds bilaterally, no wheezing, rales,rhonchi or crepitation. No use of accessory muscles of  respiration.  CARDIOVASCULAR: S1, S2 normal. No murmurs, rubs, or gallops.  ABDOMEN: Soft, tenderness in epigastrium and right upper quadrant area, nondistended. Bowel sounds present. No organomegaly or mass.  EXTREMITIES: No pedal edema,  cyanosis, or clubbing.  NEUROLOGIC: Cranial nerves II through XII are intact. Muscle strength 5/5 in all extremities. Sensation intact. Gait not checked.  PSYCHIATRIC: The patient is alert and oriented x 3.  SKIN: No obvious rash, lesion, or ulcer.   LABORATORY PANEL:   CBC Recent Labs  Lab 06/05/17 2049  WBC 17.3*  HGB 17.2  HCT 50.5  PLT 368  MCV 87.5  MCH 29.9  MCHC 34.1  RDW 13.5   ------------------------------------------------------------------------------------------------------------------  Chemistries  Recent Labs  Lab 06/05/17 2049  NA 140  K 3.5  CL 101  CO2 27  GLUCOSE 143*  BUN 17  CREATININE 0.91  CALCIUM 9.1  AST 134*  ALT 100*  ALKPHOS 131*  BILITOT 1.2   ------------------------------------------------------------------------------------------------------------------ estimated creatinine clearance is 103.3 mL/min (by C-G formula based on SCr of 0.91 mg/dL). ------------------------------------------------------------------------------------------------------------------ No results for input(s): TSH, T4TOTAL, T3FREE, THYROIDAB in the last 72 hours.  Invalid input(s): FREET3   Coagulation profile No results for input(s): INR, PROTIME in the last 168 hours. ------------------------------------------------------------------------------------------------------------------- No results for input(s): DDIMER in the last 72 hours. -------------------------------------------------------------------------------------------------------------------  Cardiac Enzymes Recent Labs  Lab 06/05/17 2049  TROPONINI <0.03   ------------------------------------------------------------------------------------------------------------------ Invalid input(s): POCBNP  ---------------------------------------------------------------------------------------------------------------  Urinalysis    Component Value Date/Time   COLORURINE YELLOW (A) 06/05/2017 2049    APPEARANCEUR CLEAR (A) 06/05/2017 2049   LABSPEC 1.011 06/05/2017 2049   PHURINE 7.0 06/05/2017 2049   GLUCOSEU NEGATIVE 06/05/2017 2049   HGBUR NEGATIVE 06/05/2017 2049   BILIRUBINUR NEGATIVE 06/05/2017 2049   KETONESUR NEGATIVE 06/05/2017 2049   PROTEINUR 30 (A) 06/05/2017 2049   NITRITE NEGATIVE 06/05/2017 2049   LEUKOCYTESUR NEGATIVE 06/05/2017 2049     RADIOLOGY: Ct Abdomen Pelvis W Contrast  Result Date: 06/06/2017 CLINICAL DATA:  Acute onset epigastric and bilateral upper abdominal pain 4 hours ago. Vomiting. Previous history of lingual tonsillar cancer post chemotherapy and x-ray therapy. EXAM: CT ABDOMEN AND PELVIS WITH CONTRAST TECHNIQUE: Multidetector CT imaging of the abdomen and pelvis was performed using the standard protocol following bolus administration of intravenous contrast. CONTRAST:  153mL ISOVUE-300 IOPAMIDOL (ISOVUE-300) INJECTION 61% COMPARISON:  PET-CT scan 02/04/2011.  Ultrasound abdomen 06/05/2017 FINDINGS: Lower chest: Mild dependent changes in the lung bases. Hepatobiliary: Several small stones in the gallbladder. The gallbladder wall is thickened and edematous with mild infiltration around the gallbladder. Changes likely to represent acute cholecystitis. No bile duct dilatation. No focal liver lesions. Pancreas: Unremarkable. No pancreatic ductal dilatation or surrounding inflammatory changes. Spleen: Normal in size without focal abnormality. Adrenals/Urinary Tract: No adrenal gland nodules. Renal nephrograms are symmetrical and homogeneous. Subcentimeter cysts in both kidneys. No hydronephrosis or hydroureter. Bladder is unremarkable. Stomach/Bowel: Stomach is within normal limits. Appendix appears normal. No evidence of bowel wall thickening, distention, or inflammatory changes. Scattered colonic diverticula without evidence of diverticulitis. Vascular/Lymphatic: No significant vascular findings are present. No enlarged abdominal or pelvic lymph nodes. Reproductive:  Prostate calcifications.  Prostate is not enlarged. Other: No abdominal wall hernia or abnormality. No abdominopelvic ascites. Musculoskeletal: No acute or significant osseous findings. IMPRESSION: 1. Cholelithiasis with inflammatory changes and gallbladder wall thickening likely representing acute cholecystitis. 2. No bowel obstruction or inflammation. Colonic diverticula without evidence of diverticulitis. Electronically Signed   By: Lucienne Capers M.D.   On: 06/06/2017 01:53  Dg Chest Portable 1 View  Result Date: 06/05/2017 CLINICAL DATA:  Severe abdominal pain starting at 1700 hours today. Dyspnea. EXAM: PORTABLE CHEST 1 VIEW COMPARISON:  None. FINDINGS: The heart size and mediastinal contours are within normal limits. Low lung volumes with minimal atelectasis at the lung bases. No free air beneath the diaphragm. The visualized skeletal structures are unremarkable. IMPRESSION: Low lung volumes with bibasilar atelectasis. Otherwise negative exam. Electronically Signed   By: Ashley Royalty M.D.   On: 06/05/2017 21:31   US Abdomen Limited Ruq  Result Date: 06/05/2017 CLINICAL DATA:  Epigastric abdominal pain. Vomiting. Symptom onset today. EXAM: ULTRASOUND ABDOMEN LIMITED RIGHT UPPER QUADRANT COMPARISON:  None. FINDINGS: Gallbladder: Physiologically distended. No gallstones or wall thickening visualized. No sonographic Murphy sign noted by sonographer. Common bile duct: Diameter: 5 mm. Liver: No focal lesion identified. Borderline increased parenchymal echogenicity. Portal vein is patent on color Doppler imaging with normal direction of blood flow towards the liver. Technically challenging exam due to bowel gas. IMPRESSION: 1. No gallstones or biliary dilatation. No evidence of gallbladder inflammation. 2. Possible mild increased hepatic parenchymal echogenicity, suspect mild steatosis. Electronically Signed   By: Jeb Levering M.D.   On: 06/05/2017 22:39    EKG: Orders placed or performed during  the hospital encounter of 06/05/17  . EKG 12-Lead  . EKG 12-Lead    IMPRESSION AND PLAN: 51 year old male patient with history of lingual tonsil cancer presented to the emergency room with abdominal pain, nausea and vomiting and elevated blood pressure.  Assessment 1. Uncontrolled hypertension 2. Acute cholecystitis 3. Abnormal liver function tests 4. Abdominal pain 5. Nausea and vomiting 6. Sepsis Treatment plan Start patient on IV hydralazine 10 mg every 4hrly when necessary for control of blood pressure Follow-up liver function tests Further management of cholecystitis as per surgical attending Follow-up lactic acid level Start patient on IV vancomycin and IV Zosyn antibiotic Antiemetics IV fluids  All the records are reviewed and case discussed with ED provider. Management plans discussed with the patient, family and they are in agreement.  CODE STATUS:FULL CODE Code Status History    This patient does not have a recorded code status. Please follow your organizational policy for patients in this situation.       TOTAL TIME TAKING CARE OF THIS PATIENT: 50 minutes.    Saundra Shelling M.D on 06/06/2017 at 4:13 AM  Between 7am to 6pm - Pager - [phone]  After 6pm go to [email or web address] - password [email or web address] [facility] Hospitalists  Office  [phone]  CC: Primary care physician; Patient, No Pcp Per

## 2017-06-06 NOTE — Anesthesia Preprocedure Evaluation (Addendum)
Anesthesia Evaluation  Patient identified by MRN, date of birth, ID band Patient awake    Reviewed: Allergy & Precautions, H&P , NPO status , Patient's Chart, lab work & pertinent test results, reviewed documented beta blocker date and time   History of Anesthesia Complications (+) PONV  Airway Mallampati: III  TM Distance: >3 FB Neck ROM: full    Dental  (+) Teeth Intact   Pulmonary neg pulmonary ROS,    Pulmonary exam normal        Cardiovascular negative cardio ROS Normal cardiovascular exam Rhythm:regular Rate:Normal     Neuro/Psych negative neurological ROS  negative psych ROS   GI/Hepatic negative GI ROS, Neg liver ROS,   Endo/Other  negative endocrine ROS  Renal/GU negative Renal ROS  negative genitourinary   Musculoskeletal negative musculoskeletal ROS (+)   Abdominal   Peds  Hematology negative hematology ROS (+)   Anesthesia Other Findings Past Medical History: No date: 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[facility])  Reproductive/Obstetrics negative OB ROS                            Anesthesia Physical Anesthesia Plan  ASA: II and emergent  Anesthesia Plan: General and Rapid Sequence   Post-op Pain Management:    Induction: Intravenous  PONV Risk Score and Plan: 4 or greater and Ondansetron, Propofol infusion, Dexamethasone and Midazolam  Airway Management Planned: Oral ETT  Additional Equipment:   Intra-op Plan:   Post-operative Plan: Extubation in OR  Informed Consent: I have reviewed the patients History and Physical, chart, labs and discussed the procedure including the risks, benefits and alternatives for the proposed anesthesia with the patient or authorized representative who has indicated his/her understanding and acceptance.   Dental advisory given  Plan Discussed with: CRNA and Surgeon  Anesthesia Plan Comments:        Anesthesia Quick Evaluation

## 2017-06-06 NOTE — Transfer of Care (Signed)
Immediate Anesthesia Transfer of Care Note  Patient: Roy Little  Procedure(s) Performed: LAPAROSCOPIC CHOLECYSTECTOMY WITH INTRAOPERATIVE CHOLANGIOGRAM (N/A Abdomen)  Patient Location: PACU  Anesthesia Type:General  Level of Consciousness: awake, alert  and oriented  Airway & Oxygen Therapy: Patient Spontanous Breathing and Patient connected to face mask oxygen  Post-op Assessment: Report given to RN and Post -op Vital signs reviewed and stable  Post vital signs: Reviewed and stable  Last Vitals:  Vitals:   06/06/17 0946 06/06/17 1208  BP: (!) 169/90 (!) 152/86  Pulse:  (!) 107  Resp:    Temp:  36.7 C  SpO2:  97%    Last Pain:  Vitals:   06/06/17 1208  TempSrc: Oral  PainSc:          Complications: No apparent anesthesia complications

## 2017-06-06 NOTE — Progress Notes (Signed)
Acute cholecystitis The risks, benefits, complications, treatment options, and expected outcomes were discussed with the patient. The possibilities of bleeding, recurrent infection, finding a normal gallbladder, perforation of viscus organs, damage to surrounding structures, bile leak, abscess formation, needing a drain placed, the need for additional procedures, reaction to medication, pulmonary aspiration,  failure to diagnose a condition, the possible need to convert to an open procedure, and creating a complication requiring transfusion or operation were discussed with the patient. The patient and/or family concurred with the proposed plan, giving informed consent.  We will do gram as well

## 2017-06-06 NOTE — Progress Notes (Signed)
Pharmacy Antibiotic Note  Roy Little is a 51 y.o. male admitted on 06/05/2017 with acute cholecystitis.  Pharmacy has been consulted for vanc/zosyn dosing.  Plan: Considering patient has acute cholecystitis w/ elevated WBC and LA of 2.2 will start zosyn; consult was also placed for vanc, will give vanc 1g x 1 since acute cholecystitis does not typically warrant broad gram-positive coverage. Will start patient on zosyn 3.375g IV q8h via extended infusion dosing    Height: 5\' 9"  (175.3 cm) Weight: 195 lb (88.5 kg) IBW/kg (Calculated) : 70.7  Temp (24hrs), Avg:98.1 F (36.7 C), Min:98.1 F (36.7 C), Max:98.1 F (36.7 C)  Recent Labs  Lab 06/05/17 2049 06/06/17 0337  WBC 17.3*  --   CREATININE 0.91  --   LATICACIDVEN  --  2.2*    Estimated Creatinine Clearance: 103.3 mL/min (by C-G formula based on SCr of 0.91 mg/dL).    No Known Allergies   Thank you for allowing pharmacy to be a part of this patient's care.  Tobie Lords, PharmD, BCPS Clinical Pharmacist 06/06/2017

## 2017-06-06 NOTE — ED Provider Notes (Signed)
-----------------------------------------   3:03 AM on 06/06/2017 -----------------------------------------   Blood pressure (!) 186/118, pulse (!) 104, temperature 98.1 F (36.7 C), temperature source Oral, resp. rate 18, height 5\' 9"  (1.753 m), weight 88.5 kg (195 lb), SpO2 96 %.  Assuming care from Dr. Cherylann Banas.  In short, Roy Little is a 51 y.o. male with a chief complaint of Abdominal Pain .  Refer to the original H&P for additional details.  The current plan of care is to follow up the result of the CT scan.  The CT scan results returned and the patient has some cholelithiasis with inflammatory changes and gallbladder wall thickening likely representing acute cholecystitis.  He has no bowel wall obstruction or inflammation.  I reviewed the patient's blood work and he does have a white blood cell count that is 17 and he does have some abnormal liver enzymes.  Although his ultrasound did not show any stones previously I am concerned about the CT report and the corresponding blood work.   I did contact Dr.Sankar who is covering for surgery.  He reports that he will come and see the patient.  The patient states that his pain is controlled at this time.  The patient was seen by the surgeon and he will be admitted to the surgical service.        Loney Hering, MD 06/06/17 [phone]

## 2017-06-06 NOTE — H&P (Signed)
Roy Little is an 51 y.o. male.   Chief Complaint: Abdominal pain   HPI: This 51 year old male presented to the emergency room with pressure consistent epigastric pain starting around 5 PM yesterday.  Prior to that he had eaten a fairly substantial Thanksgiving meal.  Pain was described as tightness and pressure sensation all across the upper abdomen but centered in the epigastric region.  He had some associated nausea but no vomiting. The patient has no history of any such problems in the past and in fact has had no abdominal or GI complaints.  The only documented health problem has been that of a squamous cell carcinoma of the tongue that was treated with 5-6 years ago successfully Past Medical History:  Diagnosis Date  . 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 No pertinent surgical history.  No family history on file. Social History:  reports that  has never smoked. he has never used smokeless tobacco. He reports that he drinks alcohol. He reports that he does not use drugs.  Allergies: No Known Allergies   (Not in a hospital admission)  Results for orders placed or performed during the hospital encounter of 06/05/17 (from the past 48 hour(s))  Lipase, blood     Status: None   Collection Time: 06/05/17  8:49 PM  Result Value Ref Range   Lipase 31 11 - 51 U/L  Comprehensive metabolic panel     Status: Abnormal   Collection Time: 06/05/17  8:49 PM  Result Value Ref Range   Sodium 140 135 - 145 mmol/L   Potassium 3.5 3.5 - 5.1 mmol/L   Chloride 101 101 - 111 mmol/L   CO2 27 22 - 32 mmol/L   Glucose, Bld 143 (H) 65 - 99 mg/dL   BUN 17 6 - 20 mg/dL   Creatinine, Ser 0.91 0.61 - 1.24 mg/dL   Calcium 9.1 8.9 - 10.3 mg/dL   Total Protein 7.9 6.5 - 8.1 g/dL   Albumin 4.8 3.5 - 5.0 g/dL   AST 134 (H) 15 - 41 U/L   ALT 100 (H) 17 - 63 U/L   Alkaline Phosphatase 131 (H) 38 - 126 U/L   Total Bilirubin 1.2 0.3 - 1.2 mg/dL   GFR calc non Af Amer >60 >60 mL/min   GFR  calc Af Amer >60 >60 mL/min    Comment: (NOTE) The eGFR has been calculated using the CKD EPI equation. This calculation has not been validated in all clinical situations. eGFR's persistently <60 mL/min signify possible Chronic Kidney Disease.    Anion gap 12 5 - 15  CBC     Status: Abnormal   Collection Time: 06/05/17  8:49 PM  Result Value Ref Range   WBC 17.3 (H) 3.8 - 10.6 K/uL   RBC 5.77 4.40 - 5.90 MIL/uL   Hemoglobin 17.2 13.0 - 18.0 g/dL   HCT 50.5 40.0 - 52.0 %   MCV 87.5 80.0 - 100.0 fL   MCH 29.9 26.0 - 34.0 pg   MCHC 34.1 32.0 - 36.0 g/dL   RDW 13.5 11.5 - 14.5 %   Platelets 368 150 - 440 K/uL  Urinalysis, Complete w Microscopic     Status: Abnormal   Collection Time: 06/05/17  8:49 PM  Result Value Ref Range   Color, Urine YELLOW (A) YELLOW   APPearance CLEAR (A) CLEAR   Specific Gravity, Urine 1.011 1.005 - 1.030   pH 7.0 5.0 - 8.0   Glucose, UA NEGATIVE  NEGATIVE mg/dL   Hgb urine dipstick NEGATIVE NEGATIVE   Bilirubin Urine NEGATIVE NEGATIVE   Ketones, ur NEGATIVE NEGATIVE mg/dL   Protein, ur 30 (A) NEGATIVE mg/dL   Nitrite NEGATIVE NEGATIVE   Leukocytes, UA NEGATIVE NEGATIVE   RBC / HPF 0-5 0 - 5 RBC/hpf   WBC, UA 0-5 0 - 5 WBC/hpf   Bacteria, UA NONE SEEN NONE SEEN   Squamous Epithelial / LPF NONE SEEN NONE SEEN   Mucus PRESENT   Troponin I     Status: None   Collection Time: 06/05/17  8:49 PM  Result Value Ref Range   Troponin I <0.03 <0.03 ng/mL   Ct Abdomen Pelvis W Contrast  Result Date: 06/06/2017 CLINICAL DATA:  Acute onset epigastric and bilateral upper abdominal pain 4 hours ago. Vomiting. Previous history of lingual tonsillar cancer post chemotherapy and x-ray therapy. EXAM: CT ABDOMEN AND PELVIS WITH CONTRAST TECHNIQUE: Multidetector CT imaging of the abdomen and pelvis was performed using the standard protocol following bolus administration of intravenous contrast. CONTRAST:  164m ISOVUE-300 IOPAMIDOL (ISOVUE-300) INJECTION 61% COMPARISON:   PET-CT scan 02/04/2011.  Ultrasound abdomen 06/05/2017 FINDINGS: Lower chest: Mild dependent changes in the lung bases. Hepatobiliary: Several small stones in the gallbladder. The gallbladder wall is thickened and edematous with mild infiltration around the gallbladder. Changes likely to represent acute cholecystitis. No bile duct dilatation. No focal liver lesions. Pancreas: Unremarkable. No pancreatic ductal dilatation or surrounding inflammatory changes. Spleen: Normal in size without focal abnormality. Adrenals/Urinary Tract: No adrenal gland nodules. Renal nephrograms are symmetrical and homogeneous. Subcentimeter cysts in both kidneys. No hydronephrosis or hydroureter. Bladder is unremarkable. Stomach/Bowel: Stomach is within normal limits. Appendix appears normal. No evidence of bowel wall thickening, distention, or inflammatory changes. Scattered colonic diverticula without evidence of diverticulitis. Vascular/Lymphatic: No significant vascular findings are present. No enlarged abdominal or pelvic lymph nodes. Reproductive: Prostate calcifications.  Prostate is not enlarged. Other: No abdominal wall hernia or abnormality. No abdominopelvic ascites. Musculoskeletal: No acute or significant osseous findings. IMPRESSION: 1. Cholelithiasis with inflammatory changes and gallbladder wall thickening likely representing acute cholecystitis. 2. No bowel obstruction or inflammation. Colonic diverticula without evidence of diverticulitis. Electronically Signed   By: WLucienne CapersM.D.   On: 06/06/2017 01:53   Dg Chest Portable 1 View  Result Date: 06/05/2017 CLINICAL DATA:  Severe abdominal pain starting at 1700 hours today. Dyspnea. EXAM: PORTABLE CHEST 1 VIEW COMPARISON:  None. FINDINGS: The heart size and mediastinal contours are within normal limits. Low lung volumes with minimal atelectasis at the lung bases. No free air beneath the diaphragm. The visualized skeletal structures are unremarkable.  IMPRESSION: Low lung volumes with bibasilar atelectasis. Otherwise negative exam. Electronically Signed   By: DAshley RoyaltyM.D.   On: 06/05/2017 21:31   UKoreaAbdomen Limited Ruq  Result Date: 06/05/2017 CLINICAL DATA:  Epigastric abdominal pain. Vomiting. Symptom onset today. EXAM: ULTRASOUND ABDOMEN LIMITED RIGHT UPPER QUADRANT COMPARISON:  None. FINDINGS: Gallbladder: Physiologically distended. No gallstones or wall thickening visualized. No sonographic Murphy sign noted by sonographer. Common bile duct: Diameter: 5 mm. Liver: No focal lesion identified. Borderline increased parenchymal echogenicity. Portal vein is patent on color Doppler imaging with normal direction of blood flow towards the liver. Technically challenging exam due to bowel gas. IMPRESSION: 1. No gallstones or biliary dilatation. No evidence of gallbladder inflammation. 2. Possible mild increased hepatic parenchymal echogenicity, suspect mild steatosis. Electronically Signed   By: MJeb LeveringM.D.   On: 06/05/2017 22:39    ROS  Blood pressure (!) 186/118, pulse (!) 104, temperature 98.1 F (36.7 C), temperature source Oral, resp. rate 18, height 5' 9" (1.753 m), weight 195 lb (88.5 kg), SpO2 96 %. Physical Exam   Assessment/Plan Impression the patient is not having any pain and says after the second dose of morphine he received he has felt much better.  Ultrasound did not show any abnormality in the gallbladder but this was reportedly a difficult study however the CT shows thickening and some edema of the gallbladder wall with what appeared to be few small stones.  White count is elevated at 17,000 and his AST and ALT are mildly elevated with normal bilirubin.  His chemistry otherwise is normal. He does not have a primary care physician and is not aware that his blood pressure has been high.  BP is persistently high here at about 180/110 along with mild tachycardia of 110. Discussed above fully with the patient and his wife and  they are agreeable to getting admitted for management of his high blood pressure and consideration of cholecystectomy Christene Lye, MD 06/06/2017, 3:23 AM

## 2017-06-06 NOTE — Anesthesia Post-op Follow-up Note (Signed)
Anesthesia QCDR form completed.        

## 2017-06-06 NOTE — Anesthesia Procedure Notes (Signed)
Procedure Name: Intubation Performed by: Demetrius Charity, CRNA Pre-anesthesia Checklist: Patient identified, Patient being monitored, Timeout performed, Emergency Drugs available and Suction available Patient Re-evaluated:Patient Re-evaluated prior to induction Oxygen Delivery Method: Circle system utilized Preoxygenation: Pre-oxygenation with 100% oxygen Induction Type: IV induction, Rapid sequence and Cricoid Pressure applied Laryngoscope Size: McGraph and 4 Grade View: Grade III Tube type: Oral Tube size: 7.0 mm Number of attempts: 1 Airway Equipment and Method: Stylet Placement Confirmation: ETT inserted through vocal cords under direct vision,  positive ETCO2 and breath sounds checked- equal and bilateral Secured at: 24 cm Tube secured with: Tape Dental Injury: Teeth and Oropharynx as per pre-operative assessment

## 2017-06-06 NOTE — Op Note (Signed)
Laparoscopic Cholecystectomy  Pre-operative Diagnosis: Acute cholecystitis  Post-operative Diagnosis: same  Procedure: Apical with intraoperative colonogram  Surgeon: Caroleen Hamman, MD FACS  Anesthesia: Gen. with endotracheal tube   Findings: Acute Cholecystitis with significant adhesions and inflammatory response Cholangiogram showing no evidence of any bile leak. No evidence of common bile Injury. Contrast reached the duodenum w/o definitive filling defects   Estimated Blood Loss: 50cc         Drains: 19 blake RUQ         Specimens: Gallbladder           Complications: none   Procedure Details  The patient was seen again in the Holding Room. The benefits, complications, treatment options, and expected outcomes were discussed with the patient. The risks of bleeding, infection, recurrence of symptoms, failure to resolve symptoms, bile duct damage, bile duct leak, retained common bile duct stone, bowel injury, any of which could require further surgery and/or ERCP, stent, or papillotomy were reviewed with the patient. The likelihood of improving the patient's symptoms with return to their baseline status is good.  The patient and/or family concurred with the proposed plan, giving informed consent.  The patient was taken to Operating Room, identified as Roy Little and the procedure verified as Laparoscopic Cholecystectomy.  A Time Out was held and the above information confirmed.  Prior to the induction of general anesthesia, antibiotic prophylaxis was administered. VTE prophylaxis was in place. General endotracheal anesthesia was then administered and tolerated well. After the induction, the abdomen was prepped with Chloraprep and draped in the sterile fashion. The patient was positioned in the supine position.  Cut down technique was used to enter the abdominal cavity and a Hasson trochar was placed after two vicryl stitches were anchored to the fascia. Pneumoperitoneum was then  created with CO2 and tolerated well without any adverse changes in the patient's vital signs.  Three 5-mm ports were placed in the right upper quadrant all under direct vision. All skin incisions  were infiltrated with a local anesthetic agent before making the incision and placing the trocars.   The patient was positioned  in reverse Trendelenburg, tilted slightly to the patient's left.  The gallbladder was identified, the fundus grasped and retracted cephalad. Adhesions were lysed bluntly. Significant inflammatory reaction and the omentum was plastered to the gallbladder. The infundibulum was grasped and retracted laterally, exposing the peritoneum overlying the triangle of Calot. This was then divided and exposed in a blunt fashion. An extended critical view of the cystic duct and cystic artery was obtained.  The cystic duct was clearly identified and bluntly dissected.   A ductotomy was performed and the cholangiogram catheter placed. Contrast injected and cholangiogram obtained. The contrast reached to the duodenum. There were no definitive filling defects. There was some artifact on the distal common bile duct difficult to exclude a common bile duct stone. No evidence of biliary injuries or Bile leaks  Artery and duct were double clipped and divided.  The gallbladder was taken from the gallbladder fossa in a retrograde fashion with the electrocautery. The gallbladder was removed and placed in an Endocatch bag. The liver bed was irrigated and inspected. Hemostasis was achieved with the electrocautery. Copious irrigation was utilized and was repeatedly aspirated until clear.  The gallbladder and Endocatch sac were then removed through a port site.     Inspection of the right upper quadrant was performed. No bleeding, bile duct injury or leak, or bowel injury was noted. Blake drain placed  on RUQ and secured w nylon sutures.  Pneumoperitoneum was released.  The periumbilical port site was closed with  interrumpted 0 Vicryl sutures. 4-0 subcuticular Monocryl was used to close the skin. Dermabond was  applied.  The patient was then extubated and brought to the recovery room in stable condition. Sponge, lap, and needle counts were correct at closure and at the conclusion of the case.               Caroleen Hamman, MD, FACS

## 2017-06-06 NOTE — ED Notes (Signed)
Called 2A and spoke with Tomasa Hosteller regarding patient; informed her that this RN spoke with the admitting MD about the pt's BP and HR, and received verbal confirmation that the MD will put orders in for meds to address both of those issues. Also informed the MD of the pt's lactic acid value and received confirmation from the MD about ordering antibiotics meds for the patient. Pt is being transported to the floor.

## 2017-06-07 DIAGNOSIS — K810 Acute cholecystitis: Secondary | ICD-10-CM | POA: Diagnosis not present

## 2017-06-07 DIAGNOSIS — I10 Essential (primary) hypertension: Secondary | ICD-10-CM | POA: Diagnosis not present

## 2017-06-07 DIAGNOSIS — R945 Abnormal results of liver function studies: Secondary | ICD-10-CM | POA: Diagnosis not present

## 2017-06-07 LAB — COMPREHENSIVE METABOLIC PANEL
ALK PHOS: 117 U/L (ref 38–126)
ALT: 375 U/L — ABNORMAL HIGH (ref 17–63)
ANION GAP: 8 (ref 5–15)
AST: 255 U/L — ABNORMAL HIGH (ref 15–41)
Albumin: 3.5 g/dL (ref 3.5–5.0)
BUN: 11 mg/dL (ref 6–20)
CHLORIDE: 106 mmol/L (ref 101–111)
CO2: 25 mmol/L (ref 22–32)
Calcium: 8.3 mg/dL — ABNORMAL LOW (ref 8.9–10.3)
Creatinine, Ser: 0.9 mg/dL (ref 0.61–1.24)
GFR calc non Af Amer: >60 mL/min (ref >60)
GLUCOSE: 129 mg/dL — AB (ref 65–99)
POTASSIUM: 3.5 mmol/L (ref 3.5–5.1)
Sodium: 139 mmol/L (ref 135–145)
Total Bilirubin: 2.1 mg/dL — ABNORMAL HIGH (ref 0.3–1.2)
Total Protein: 6.2 g/dL — ABNORMAL LOW (ref 6.5–8.1)

## 2017-06-07 LAB — CBC
HEMATOCRIT: 40.6 % (ref 40.0–52.0)
HEMOGLOBIN: 14.5 g/dL (ref 13.0–18.0)
MCH: 31.6 pg (ref 26.0–34.0)
MCHC: 35.8 g/dL (ref 32.0–36.0)
MCV: 88.2 fL (ref 80.0–100.0)
Platelets: 278 10*3/uL (ref 150–440)
RBC: 4.61 MIL/uL (ref 4.40–5.90)
RDW: 13.7 % (ref 11.5–14.5)
WBC: 10.7 10*3/uL — ABNORMAL HIGH (ref 3.8–10.6)

## 2017-06-07 LAB — HEMOGLOBIN A1C
Hgb A1c MFr Bld: 5.2 % (ref 4.8–5.6)
MEAN PLASMA GLUCOSE: 103 mg/dL

## 2017-06-07 LAB — HIV ANTIBODY (ROUTINE TESTING W REFLEX): HIV Screen 4th Generation wRfx: NONREACTIVE

## 2017-06-07 MED ORDER — AMLODIPINE BESYLATE 5 MG PO TABS
5.0000 mg | ORAL_TABLET | Freq: Every day | ORAL | Status: DC
  Administered 2017-06-07 – 2017-06-08 (×2): 5 mg via ORAL
  Filled 2017-06-07 (×2): qty 1

## 2017-06-07 MED ORDER — DIPHENHYDRAMINE HCL 25 MG PO CAPS
25.0000 mg | ORAL_CAPSULE | Freq: Every evening | ORAL | Status: DC | PRN
  Administered 2017-06-08: 25 mg via ORAL
  Filled 2017-06-07: qty 1

## 2017-06-07 NOTE — Progress Notes (Signed)
[place] [facility] [place] NAME: Karthik Whittinghill    MR#:  [id number]  DATE OF BIRTH:  [birth year]  SUBJECTIVE:  CHIEF COMPLAINT:   Chief Complaint  Patient presents with  . Abdominal Pain   Some abdominal pain with surgery.  Tolerating diet.  Blood pressure continues to be high.  REVIEW OF SYSTEMS:    Review of Systems  Constitutional: Positive for malaise/fatigue. Negative for chills and fever.  HENT: Negative for sore throat.   Eyes: Negative for blurred vision, double vision and pain.  Respiratory: Negative for cough, hemoptysis, shortness of breath and wheezing.   Cardiovascular: Negative for chest pain, palpitations, orthopnea and leg swelling.  Gastrointestinal: Positive for abdominal pain. Negative for constipation, diarrhea, heartburn, nausea and vomiting.  Genitourinary: Negative for dysuria and hematuria.  Musculoskeletal: Negative for back pain and joint pain.  Skin: Negative for rash.  Neurological: Positive for weakness. Negative for sensory change, speech change, focal weakness and headaches.  Endo/Heme/Allergies: Does not bruise/bleed easily.  Psychiatric/Behavioral: Negative for depression. The patient is not nervous/anxious.     DRUG ALLERGIES:  No Known Allergies  VITALS:  Blood pressure (!) 165/95, pulse 74, temperature 98.5 F (36.9 C), temperature source Oral, resp. rate 18, height 5\' 9"  (1.753 m), weight 87.2 kg (192 lb 3.9 oz), SpO2 97 %.  PHYSICAL EXAMINATION:   Physical Exam  GENERAL:  51 y.o.-year-old patient lying in the bed with no acute distress.  EYES: Pupils equal, round, reactive to light and accommodation. No scleral icterus. Extraocular muscles intact.  HEENT: Head atraumatic, normocephalic. Oropharynx and nasopharynx clear.  NECK:  Supple, no jugular venous distention. No thyroid enlargement, no tenderness.  LUNGS: Normal breath sounds bilaterally, no wheezing, rales, rhonchi. No use of accessory muscles  of respiration.  CARDIOVASCULAR: S1, S2 normal. No murmurs, rubs, or gallops.  ABDOMEN: Soft, tenderness at surgical site, nondistended. Bowel sounds present. No organomegaly or mass.  JP drain in place EXTREMITIES: No cyanosis, clubbing or edema b/l.    NEUROLOGIC: Cranial nerves II through XII are intact. No focal Motor or sensory deficits b/l.   PSYCHIATRIC: The patient is alert and oriented x 3.  SKIN: No obvious rash, lesion, or ulcer.   LABORATORY PANEL:   CBC Recent Labs  Lab 06/07/17 0628  WBC 10.7*  HGB 14.5  HCT 40.6  PLT 278   ------------------------------------------------------------------------------------------------------------------ Chemistries  Recent Labs  Lab 06/07/17 0628  NA 139  K 3.5  CL 106  CO2 25  GLUCOSE 129*  BUN 11  CREATININE 0.90  CALCIUM 8.3*  AST 255*  ALT 375*  ALKPHOS 117  BILITOT 2.1*   ------------------------------------------------------------------------------------------------------------------  Cardiac Enzymes Recent Labs  Lab 06/05/17 2049  TROPONINI <0.03   ------------------------------------------------------------------------------------------------------------------  RADIOLOGY:  Dg Cholangiogram Operative  Result Date: 06/06/2017 CLINICAL DATA:  51 year old male with a history of cholelithiasis EXAM: INTRAOPERATIVE CHOLANGIOGRAM TECHNIQUE: Cholangiographic images from the C-arm fluoroscopic device were submitted for interpretation post-operatively. Please see the procedural report for the amount of contrast and the fluoroscopy time utilized. COMPARISON:  CT 06/06/2017 FINDINGS: Surgical instruments project over the upper abdomen. There is cannulation of the cystic duct/gallbladder neck, with antegrade infusion of contrast. Caliber of the extrahepatic ductal system within normal limits. No large filling defect identified. Free flow of contrast across the ampulla. IMPRESSION: Intraoperative cholangiogram demonstrates  extrahepatic biliary ducts of unremarkable caliber, with no large filling defect identified. Free flow of contrast across the ampulla. Please refer to the dictated operative report  for full details of intraoperative findings and procedure Electronically Signed   By: Corrie Mckusick D.O.   On: 06/06/2017 17:22   Ct Abdomen Pelvis W Contrast  Result Date: 06/06/2017 CLINICAL DATA:  Acute onset epigastric and bilateral upper abdominal pain 4 hours ago. Vomiting. Previous history of lingual tonsillar cancer post chemotherapy and x-ray therapy. EXAM: CT ABDOMEN AND PELVIS WITH CONTRAST TECHNIQUE: Multidetector CT imaging of the abdomen and pelvis was performed using the standard protocol following bolus administration of intravenous contrast. CONTRAST:  153mL ISOVUE-300 IOPAMIDOL (ISOVUE-300) INJECTION 61% COMPARISON:  PET-CT scan 02/04/2011.  Ultrasound abdomen 06/05/2017 FINDINGS: Lower chest: Mild dependent changes in the lung bases. Hepatobiliary: Several small stones in the gallbladder. The gallbladder wall is thickened and edematous with mild infiltration around the gallbladder. Changes likely to represent acute cholecystitis. No bile duct dilatation. No focal liver lesions. Pancreas: Unremarkable. No pancreatic ductal dilatation or surrounding inflammatory changes. Spleen: Normal in size without focal abnormality. Adrenals/Urinary Tract: No adrenal gland nodules. Renal nephrograms are symmetrical and homogeneous. Subcentimeter cysts in both kidneys. No hydronephrosis or hydroureter. Bladder is unremarkable. Stomach/Bowel: Stomach is within normal limits. Appendix appears normal. No evidence of bowel wall thickening, distention, or inflammatory changes. Scattered colonic diverticula without evidence of diverticulitis. Vascular/Lymphatic: No significant vascular findings are present. No enlarged abdominal or pelvic lymph nodes. Reproductive: Prostate calcifications.  Prostate is not enlarged. Other: No abdominal  wall hernia or abnormality. No abdominopelvic ascites. Musculoskeletal: No acute or significant osseous findings. IMPRESSION: 1. Cholelithiasis with inflammatory changes and gallbladder wall thickening likely representing acute cholecystitis. 2. No bowel obstruction or inflammation. Colonic diverticula without evidence of diverticulitis. Electronically Signed   By: Lucienne Capers M.D.   On: 06/06/2017 01:53   Dg Chest Portable 1 View  Result Date: 06/05/2017 CLINICAL DATA:  Severe abdominal pain starting at 1700 hours today. Dyspnea. EXAM: PORTABLE CHEST 1 VIEW COMPARISON:  None. FINDINGS: The heart size and mediastinal contours are within normal limits. Low lung volumes with minimal atelectasis at the lung bases. No free air beneath the diaphragm. The visualized skeletal structures are unremarkable. IMPRESSION: Low lung volumes with bibasilar atelectasis. Otherwise negative exam. Electronically Signed   By: Ashley Royalty M.D.   On: 06/05/2017 21:31   US Abdomen Limited Ruq  Result Date: 06/05/2017 CLINICAL DATA:  Epigastric abdominal pain. Vomiting. Symptom onset today. EXAM: ULTRASOUND ABDOMEN LIMITED RIGHT UPPER QUADRANT COMPARISON:  None. FINDINGS: Gallbladder: Physiologically distended. No gallstones or wall thickening visualized. No sonographic Murphy sign noted by sonographer. Common bile duct: Diameter: 5 mm. Liver: No focal lesion identified. Borderline increased parenchymal echogenicity. Portal vein is patent on color Doppler imaging with normal direction of blood flow towards the liver. Technically challenging exam due to bowel gas. IMPRESSION: 1. No gallstones or biliary dilatation. No evidence of gallbladder inflammation. 2. Possible mild increased hepatic parenchymal echogenicity, suspect mild steatosis. Electronically Signed   By: Jeb Levering M.D.   On: 06/05/2017 22:39     ASSESSMENT AND PLAN:   *Hypertension Patient on metoprolol 25 twice daily.  Blood pressure continues to be  high.  Will start Norvasc. Continue to monitor.  Would like blood pressure to slowly trend down.  IV as needed medications.  *Acute cholecystitis.  Status post cholecystectomy.  On IV antibiotics.  Discussed with Dr. Perrin Maltese of surgery.  Likely discharge home tomorrow.  All the records are reviewed and case discussed with Care Management/Social Worker Management plans discussed with the patient, family and they are in agreement.  CODE  STATUS: FULL CODE  DVT Prophylaxis: SCDs  TOTAL TIME TAKING CARE OF THIS PATIENT: 35 minutes.   POSSIBLE D/C IN 1-2 DAYS, DEPENDING ON CLINICAL CONDITION.  Neita Carp M.D on 06/07/2017 at 3:13 PM  Between 7am to 6pm - Pager - [phone]  After 6pm go to [email or web address] - password [email or web address] [facility]  Office  [phone]  CC: Primary care physician; Patient, No Pcp Per  Note: This dictation was prepared with Dragon dictation along with smaller phrase technology. Any transcriptional errors that result from this process are unintentional.

## 2017-06-07 NOTE — Progress Notes (Signed)
POD # 1 60cc serous JP Feeling much better Taking clears  PE NAD Abd: incision c/d/i, no infection . Serous JP  A/P Doing well continue A/bs for severe cholecystitis DC in am

## 2017-06-08 DIAGNOSIS — I10 Essential (primary) hypertension: Secondary | ICD-10-CM | POA: Diagnosis not present

## 2017-06-08 DIAGNOSIS — R945 Abnormal results of liver function studies: Secondary | ICD-10-CM | POA: Diagnosis not present

## 2017-06-08 DIAGNOSIS — K810 Acute cholecystitis: Secondary | ICD-10-CM | POA: Diagnosis not present

## 2017-06-08 MED ORDER — AMLODIPINE BESYLATE 5 MG PO TABS: 5.0000 mg | ORAL_TABLET | Freq: Every day | ORAL | 0 refills | Status: DC

## 2017-06-08 MED ORDER — METOPROLOL TARTRATE 25 MG PO TABS: 25.0000 mg | ORAL_TABLET | Freq: Two times a day (BID) | ORAL | 1 refills | Status: DC

## 2017-06-08 MED ORDER — HYDROCODONE-ACETAMINOPHEN 5-325 MG PO TABS: 1.0000 | ORAL_TABLET | Freq: Four times a day (QID) | ORAL | 0 refills | Status: DC | PRN

## 2017-06-08 NOTE — Progress Notes (Signed)
  June 08, 2017  Patient: Roy Little  Date of Birth: 1966/02/12  Date of Visit: 06/05/2017    To Whom It May Concern:  Roy Little was seen and treated in our emergency department or urgent care center on 06/05/2017. Roy Little  may return to work on 11/28.  Sincerely,

## 2017-06-08 NOTE — Progress Notes (Signed)
Discharged to home with his wife.  Instructions on care of JP drain site - not to submerge it in water.  If it does get wet, dry it and re bandage with a band aid or gauze.

## 2017-06-08 NOTE — Progress Notes (Signed)
Drain removed.  No bleeding nor drainage from the site.

## 2017-06-08 NOTE — Discharge Instructions (Addendum)

## 2017-06-08 NOTE — Discharge Summary (Signed)
  Patient ID: Roy Little MRN: [id number] DOB/AGE: 17-Jan-1966 51 y.o.  Admit date: 06/05/2017 Discharge date: 06/08/2017   Discharge Diagnoses:  Active Problems:   Cholecystitis, acute with cholelithiasis   Procedures: Laparoscopic cholecystectomy  Hospital Course: 51 year old male admitted with abdominal pain consistent with acute cholecystitis. He was also newly diagnosed uncontrolled hypertension. He was admitted for optimization of his blood pressure and hydration once this was improved he was taken to is room prior uneventful laparoscopic cholecystectomy. He was significantly inflamed and he was kept in the hospital to receive parenteral antibiotics for 48 hours. At time of discharge she was ambulating he was tolerating regular diet and there was minimal output from his JP drain that was serous. His physical exam at discharge showed a male in no acute distress. Awake alert. Abdomen: Soft incision is healing well without evidence of infection or peritonitis. Extremities: No edema well perfused. Condition at the time of discharge is stable  Consults: Hospitalist  Disposition: Final discharge disposition not confirmed  Discharge Instructions    Call MD for:  difficulty breathing, headache or visual disturbances   Complete by:  As directed    Call MD for:  extreme fatigue   Complete by:  As directed    Call MD for:  hives   Complete by:  As directed    Call MD for:  persistant dizziness or light-headedness   Complete by:  As directed    Call MD for:  persistant nausea and vomiting   Complete by:  As directed    Call MD for:  redness, tenderness, or signs of infection (pain, swelling, redness, odor or green/yellow discharge around incision site)   Complete by:  As directed    Call MD for:  severe uncontrolled pain   Complete by:  As directed    Call MD for:  temperature >100.4   Complete by:  As directed    Diet - low sodium heart healthy   Complete by:  As directed    Discharge instructions   Complete by:  As directed    Shower starting today, cover drain orifice   Increase activity slowly   Complete by:  As directed    Lifting restrictions   Complete by:  As directed    20 lbs x 6 wks     Allergies as of 06/08/2017   No Known Allergies     Medication List    TAKE these medications   amLODipine 5 MG tablet Commonly known as:  NORVASC Take 1 tablet (5 mg total) by mouth daily.   finasteride 5 MG tablet Commonly known as:  PROSCAR Take 1 tablet by mouth daily. Notes to patient:  None given today   HYDROcodone-acetaminophen 5-325 MG tablet Commonly known as:  NORCO/VICODIN Take 1-2 tablets by mouth every 6 (six) hours as needed for moderate pain. Notes to patient:  Last dose was this morning at 5:40   metoprolol tartrate 25 MG tablet Commonly known as:  LOPRESSOR Take 1 tablet (25 mg total) by mouth 2 (two) times daily.      Follow-up Information    [facility] Follow up in 10 day(s).            Caroleen Hamman, MD FACS

## 2017-06-08 NOTE — Progress Notes (Signed)
[facility] [place] NAME: Roy Little    MR#:  [id number]  DATE OF BIRTH:  17-Oct-1965  SUBJECTIVE:  CHIEF COMPLAINT:   Chief Complaint  Patient presents with  . Abdominal Pain   Wants to go home, requesting few days off  REVIEW OF SYSTEMS:    Review of Systems  Constitutional: Positive for malaise/fatigue. Negative for chills and fever.  HENT: Negative for sore throat.   Eyes: Negative for blurred vision, double vision and pain.  Respiratory: Negative for cough, hemoptysis, shortness of breath and wheezing.   Cardiovascular: Negative for chest pain, palpitations, orthopnea and leg swelling.  Gastrointestinal: Positive for abdominal pain. Negative for constipation, diarrhea, heartburn, nausea and vomiting.  Genitourinary: Negative for dysuria and hematuria.  Musculoskeletal: Negative for back pain and joint pain.  Skin: Negative for rash.  Neurological: Positive for weakness. Negative for sensory change, speech change, focal weakness and headaches.  Endo/Heme/Allergies: Does not bruise/bleed easily.  Psychiatric/Behavioral: Negative for depression. The patient is not nervous/anxious.     DRUG ALLERGIES:  No Known Allergies  VITALS:  Blood pressure (!) 165/103, pulse 83, temperature 97.7 F (36.5 C), resp. rate 18, height 5\' 9"  (1.753 m), weight 87.2 kg (192 lb 3.9 oz), SpO2 97 %.  PHYSICAL EXAMINATION:   Physical Exam  GENERAL:  51 y.o.-year-old patient lying in the bed with no acute distress.  EYES: Pupils equal, round, reactive to light and accommodation. No scleral icterus. Extraocular muscles intact.  HEENT: Head atraumatic, normocephalic. Oropharynx and nasopharynx clear.  NECK:  Supple, no jugular venous distention. No thyroid enlargement, no tenderness.  LUNGS: Normal breath sounds bilaterally, no wheezing, rales, rhonchi. No use of accessory muscles of respiration.  CARDIOVASCULAR: S1, S2 normal. No murmurs, rubs, or  gallops.  ABDOMEN: Soft, tenderness at surgical site, nondistended. Bowel sounds present. No organomegaly or mass.  JP drain in place EXTREMITIES: No cyanosis, clubbing or edema b/l.    NEUROLOGIC: Cranial nerves II through XII are intact. No focal Motor or sensory deficits b/l.   PSYCHIATRIC: The patient is alert and oriented x 3.  SKIN: No obvious rash, lesion, or ulcer.   LABORATORY PANEL:   CBC Recent Labs  Lab 06/07/17 0628  WBC 10.7*  HGB 14.5  HCT 40.6  PLT 278   ------------------------------------------------------------------------------------------------------------------ Chemistries  Recent Labs  Lab 06/07/17 0628  NA 139  K 3.5  CL 106  CO2 25  GLUCOSE 129*  BUN 11  CREATININE 0.90  CALCIUM 8.3*  AST 255*  ALT 375*  ALKPHOS 117  BILITOT 2.1*   ------------------------------------------------------------------------------------------------------------------  Cardiac Enzymes Recent Labs  Lab 06/05/17 2049  TROPONINI <0.03   ------------------------------------------------------------------------------------------------------------------  RADIOLOGY:  Dg Cholangiogram Operative  Result Date: 06/06/2017 CLINICAL DATA:  51 year old male with a history of cholelithiasis EXAM: INTRAOPERATIVE CHOLANGIOGRAM TECHNIQUE: Cholangiographic images from the C-arm fluoroscopic device were submitted for interpretation post-operatively. Please see the procedural report for the amount of contrast and the fluoroscopy time utilized. COMPARISON:  CT 06/06/2017 FINDINGS: Surgical instruments project over the upper abdomen. There is cannulation of the cystic duct/gallbladder neck, with antegrade infusion of contrast. Caliber of the extrahepatic ductal system within normal limits. No large filling defect identified. Free flow of contrast across the ampulla. IMPRESSION: Intraoperative cholangiogram demonstrates extrahepatic biliary ducts of unremarkable caliber, with no large  filling defect identified. Free flow of contrast across the ampulla. Please refer to the dictated operative report for full details of intraoperative findings and procedure Electronically Signed  By: Corrie Mckusick D.O.   On: 06/06/2017 17:22     ASSESSMENT AND PLAN:   *Hypertension - continue metoprolol 25 twice daily and norvasc at D/C  *Acute cholecystitis.  Status post cholecystectomy.  D/C per primary team  All the records are reviewed and case discussed with Care Management/Social Worker Management plans discussed with the patient, family and they are in agreement.  CODE STATUS: FULL CODE  DVT Prophylaxis: SCDs  TOTAL TIME TAKING CARE OF THIS PATIENT: 15 minutes.     Max Sane M.D on 06/08/2017 at 1:49 PM  Between 7am to 6pm - Pager - [phone]  After 6pm go to [email or web address] - password [email or web address] [place] [facility]  Office  [phone]  CC: Primary care physician; Patient, No Pcp Per  Note: This dictation was prepared with Dragon dictation along with smaller phrase technology. Any transcriptional errors that result from this process are unintentional.

## 2017-06-09 ENCOUNTER — Encounter: Payer: Self-pay | Admitting: Surgery

## 2017-06-10 ENCOUNTER — Observation Stay
Admission: EM | Admit: 2017-06-10 | Discharge: 2017-06-11 | Disposition: A | Discharge status: Home / Self Care | Payer: 59 | Attending: Surgery | Admitting: Surgery

## 2017-06-10 ENCOUNTER — Emergency Department: Payer: 59 | Admitting: Anesthesiology

## 2017-06-10 ENCOUNTER — Ambulatory Visit: Payer: 59

## 2017-06-10 ENCOUNTER — Other Ambulatory Visit: Payer: Self-pay

## 2017-06-10 ENCOUNTER — Emergency Department: Payer: 59

## 2017-06-10 ENCOUNTER — Encounter: Payer: Self-pay | Admitting: Emergency Medicine

## 2017-06-10 ENCOUNTER — Encounter
Admission: EM | Disposition: A | Discharge status: Home / Self Care | Payer: Self-pay | Source: Home / Self Care | Attending: Emergency Medicine

## 2017-06-10 DIAGNOSIS — K8050 Calculus of bile duct without cholangitis or cholecystitis without obstruction: Secondary | ICD-10-CM | POA: Insufficient documentation

## 2017-06-10 DIAGNOSIS — R1011 Right upper quadrant pain: Secondary | ICD-10-CM

## 2017-06-10 DIAGNOSIS — E872 Acidosis, unspecified: Secondary | ICD-10-CM

## 2017-06-10 DIAGNOSIS — Z85810 Personal history of malignant neoplasm of tongue: Secondary | ICD-10-CM | POA: Diagnosis not present

## 2017-06-10 DIAGNOSIS — K9189 Other postprocedural complications and disorders of digestive system: Principal | ICD-10-CM | POA: Insufficient documentation

## 2017-06-10 DIAGNOSIS — R109 Unspecified abdominal pain: Secondary | ICD-10-CM | POA: Diagnosis not present

## 2017-06-10 DIAGNOSIS — R17 Unspecified jaundice: Secondary | ICD-10-CM

## 2017-06-10 DIAGNOSIS — Z9049 Acquired absence of other specified parts of digestive tract: Secondary | ICD-10-CM | POA: Diagnosis not present

## 2017-06-10 DIAGNOSIS — Y838 Other surgical procedures as the cause of abnormal reaction of the patient, or of later complication, without mention of misadventure at the time of the procedure: Secondary | ICD-10-CM | POA: Diagnosis not present

## 2017-06-10 DIAGNOSIS — R1013 Epigastric pain: Secondary | ICD-10-CM | POA: Diagnosis not present

## 2017-06-10 DIAGNOSIS — K8037 Calculus of bile duct with acute and chronic cholangitis with obstruction: Secondary | ICD-10-CM | POA: Diagnosis not present

## 2017-06-10 HISTORY — PX: ERCP: SHX5425

## 2017-06-10 LAB — COMPREHENSIVE METABOLIC PANEL
ALT: 595 U/L — AB (ref 17–63)
AST: 196 U/L — AB (ref 15–41)
Albumin: 4.3 g/dL (ref 3.5–5.0)
Alkaline Phosphatase: 318 U/L — ABNORMAL HIGH (ref 38–126)
Anion gap: 14 (ref 5–15)
BILIRUBIN TOTAL: 3.1 mg/dL — AB (ref 0.3–1.2)
BUN: 14 mg/dL (ref 6–20)
CO2: 25 mmol/L (ref 22–32)
Calcium: 9.1 mg/dL (ref 8.9–10.3)
Chloride: 100 mmol/L — ABNORMAL LOW (ref 101–111)
Creatinine, Ser: 0.99 mg/dL (ref 0.61–1.24)
GFR calc Af Amer: >60 mL/min (ref >60)
GLUCOSE: 148 mg/dL — AB (ref 65–99)
Potassium: 3.7 mmol/L (ref 3.5–5.1)
Sodium: 139 mmol/L (ref 135–145)
TOTAL PROTEIN: 7.5 g/dL (ref 6.5–8.1)

## 2017-06-10 LAB — URINALYSIS, COMPLETE (UACMP) WITH MICROSCOPIC
BACTERIA UA: NONE SEEN
Bilirubin Urine: NEGATIVE
Glucose, UA: NEGATIVE mg/dL
Hgb urine dipstick: NEGATIVE
KETONES UR: NEGATIVE mg/dL
Leukocytes, UA: NEGATIVE
Nitrite: NEGATIVE
Protein, ur: NEGATIVE mg/dL
SQUAMOUS EPITHELIAL / LPF: NONE SEEN
Specific Gravity, Urine: 1.034 — ABNORMAL HIGH (ref 1.005–1.030)
pH: 7 (ref 5.0–8.0)

## 2017-06-10 LAB — SURGICAL PATHOLOGY

## 2017-06-10 LAB — CBC WITH DIFFERENTIAL/PLATELET
BASOS ABS: 0.1 10*3/uL (ref 0–0.1)
Basophils Relative: 1 %
Eosinophils Absolute: 0.1 10*3/uL (ref 0–0.7)
Eosinophils Relative: 2 %
HEMATOCRIT: 49 % (ref 40.0–52.0)
Hemoglobin: 16.5 g/dL (ref 13.0–18.0)
Lymphocytes Relative: 12 %
Lymphs Abs: 1.1 10*3/uL (ref 1.0–3.6)
MCH: 30.1 pg (ref 26.0–34.0)
MCHC: 33.6 g/dL (ref 32.0–36.0)
MCV: 89.3 fL (ref 80.0–100.0)
MONO ABS: 0.7 10*3/uL (ref 0.2–1.0)
Monocytes Relative: 8 %
NEUTROS ABS: 7.2 10*3/uL — AB (ref 1.4–6.5)
NEUTROS PCT: 77 %
Platelets: 358 10*3/uL (ref 150–440)
RBC: 5.48 MIL/uL (ref 4.40–5.90)
RDW: 14 % (ref 11.5–14.5)
WBC: 9.2 10*3/uL (ref 3.8–10.6)

## 2017-06-10 LAB — BILIRUBIN, DIRECT: Bilirubin, Direct: 1.5 mg/dL — ABNORMAL HIGH (ref 0.1–0.5)

## 2017-06-10 LAB — LACTIC ACID, PLASMA: Lactic Acid, Venous: 2.2 mmol/L (ref 0.5–1.9)

## 2017-06-10 LAB — LIPASE, BLOOD: LIPASE: 22 U/L (ref 11–51)

## 2017-06-10 SURGERY — Surgical Case
Anesthesia: *Unknown

## 2017-06-10 SURGERY — ERCP, WITH INTERVENTION IF INDICATED
Anesthesia: General

## 2017-06-10 SURGERY — ERCP, WITH INTERVENTION IF INDICATED
Anesthesia: Monitor Anesthesia Care

## 2017-06-10 MED ORDER — PROPOFOL 10 MG/ML IV BOLUS
INTRAVENOUS | Status: DC | PRN
  Administered 2017-06-10: 60 mg via INTRAVENOUS

## 2017-06-10 MED ORDER — FENTANYL CITRATE (PF) 100 MCG/2ML IJ SOLN
100.0000 ug | Freq: Once | INTRAMUSCULAR | Status: AC
Start: 2017-06-10 — End: 2017-06-10
  Administered 2017-06-10: 100 ug via INTRAVENOUS

## 2017-06-10 MED ORDER — PROPOFOL 500 MG/50ML IV EMUL
INTRAVENOUS | Status: AC
  Filled 2017-06-10: qty 50

## 2017-06-10 MED ORDER — ONDANSETRON HCL 4 MG/2ML IJ SOLN
INTRAMUSCULAR | Status: AC
  Administered 2017-06-10: 4 mg via INTRAVENOUS
  Filled 2017-06-10: qty 2

## 2017-06-10 MED ORDER — METRONIDAZOLE IN NACL 5-0.79 MG/ML-% IV SOLN
500.0000 mg | Freq: Once | INTRAVENOUS | Status: DC
  Administered 2017-06-10: 500 mg via INTRAVENOUS
  Filled 2017-06-10: qty 100

## 2017-06-10 MED ORDER — ONDANSETRON HCL 4 MG/2ML IJ SOLN
4.0000 mg | Freq: Once | INTRAMUSCULAR | Status: AC
  Administered 2017-06-10: 4 mg via INTRAVENOUS
  Filled 2017-06-10: qty 2

## 2017-06-10 MED ORDER — MORPHINE SULFATE (PF) 4 MG/ML IV SOLN
4.0000 mg | Freq: Once | INTRAVENOUS | Status: AC
Start: 2017-06-10 — End: 2017-06-10
  Administered 2017-06-10: 4 mg via INTRAVENOUS
  Filled 2017-06-10: qty 1

## 2017-06-10 MED ORDER — FENTANYL CITRATE (PF) 100 MCG/2ML IJ SOLN
INTRAMUSCULAR | Status: AC
  Administered 2017-06-10: 100 ug via INTRAVENOUS
  Filled 2017-06-10: qty 2

## 2017-06-10 MED ORDER — DEXTROSE IN LACTATED RINGERS 5 % IV SOLN
INTRAVENOUS | Status: DC
  Administered 2017-06-10 – 2017-06-11 (×2): via INTRAVENOUS

## 2017-06-10 MED ORDER — LIDOCAINE HCL (PF) 2 % IJ SOLN
INTRAMUSCULAR | Status: AC
  Filled 2017-06-10: qty 10

## 2017-06-10 MED ORDER — FENTANYL CITRATE (PF) 100 MCG/2ML IJ SOLN
100.0000 ug | Freq: Once | INTRAMUSCULAR | Status: AC
  Administered 2017-06-10: 100 ug via INTRAVENOUS

## 2017-06-10 MED ORDER — LIDOCAINE HCL (CARDIAC) 20 MG/ML IV SOLN
INTRAVENOUS | Status: DC | PRN
  Administered 2017-06-10: 60 mg via INTRAVENOUS

## 2017-06-10 MED ORDER — IOPAMIDOL (ISOVUE-300) INJECTION 61%
100.0000 mL | Freq: Once | INTRAVENOUS | Status: AC | PRN
  Administered 2017-06-10: 100 mL via INTRAVENOUS

## 2017-06-10 MED ORDER — INDOMETHACIN 50 MG RE SUPP
RECTAL | Status: AC
  Filled 2017-06-10: qty 2

## 2017-06-10 MED ORDER — ACETAMINOPHEN 325 MG PO TABS: 650.0000 mg | ORAL_TABLET | Freq: Four times a day (QID) | ORAL | Status: DC | PRN

## 2017-06-10 MED ORDER — ACETAMINOPHEN 650 MG RE SUPP
650.0000 mg | Freq: Four times a day (QID) | RECTAL | Status: DC | PRN
  Filled 2017-06-10: qty 1

## 2017-06-10 MED ORDER — ONDANSETRON HCL 4 MG/2ML IJ SOLN: 4.0000 mg | Freq: Four times a day (QID) | INTRAMUSCULAR | Status: DC | PRN

## 2017-06-10 MED ORDER — INDOMETHACIN 50 MG RE SUPP
100.0000 mg | Freq: Once | RECTAL | Status: AC
  Administered 2017-06-10: 100 mg via RECTAL

## 2017-06-10 MED ORDER — SODIUM CHLORIDE 0.9 % IV BOLUS (SEPSIS)
1000.0000 mL | Freq: Once | INTRAVENOUS | Status: AC
  Administered 2017-06-10: 1000 mL via INTRAVENOUS

## 2017-06-10 MED ORDER — MORPHINE SULFATE (PF) 4 MG/ML IV SOLN
2.0000 mg | INTRAVENOUS | Status: DC | PRN
  Administered 2017-06-10: 2 mg via INTRAVENOUS
  Filled 2017-06-10: qty 1

## 2017-06-10 MED ORDER — SODIUM CHLORIDE 0.9 % IV SOLN
Freq: Once | INTRAVENOUS | Status: AC
  Administered 2017-06-10: 16:00:00 via INTRAVENOUS

## 2017-06-10 MED ORDER — LEVOFLOXACIN IN D5W 750 MG/150ML IV SOLN
750.0000 mg | Freq: Once | INTRAVENOUS | Status: DC
  Administered 2017-06-10: 750 mg via INTRAVENOUS
  Filled 2017-06-10: qty 150

## 2017-06-10 MED ORDER — LABETALOL HCL 5 MG/ML IV SOLN
10.0000 mg | Freq: Once | INTRAVENOUS | Status: AC
  Administered 2017-06-10: 10 mg via INTRAVENOUS
  Filled 2017-06-10: qty 4

## 2017-06-10 MED ORDER — FENTANYL CITRATE (PF) 100 MCG/2ML IJ SOLN
INTRAMUSCULAR | Status: AC
  Filled 2017-06-10: qty 2

## 2017-06-10 MED ORDER — PROPOFOL 500 MG/50ML IV EMUL
INTRAVENOUS | Status: DC | PRN
  Administered 2017-06-10: 160 ug/kg/min via INTRAVENOUS

## 2017-06-10 MED ORDER — ENOXAPARIN SODIUM 40 MG/0.4ML ~~LOC~~ SOLN
40.0000 mg | SUBCUTANEOUS | Status: DC
  Administered 2017-06-11: 40 mg via SUBCUTANEOUS
  Filled 2017-06-10 (×3): qty 0.4

## 2017-06-10 MED ORDER — SODIUM CHLORIDE 0.9 % IV SOLN
INTRAVENOUS | Status: DC
  Administered 2017-06-10: 18:00:00 via INTRAVENOUS

## 2017-06-10 MED ORDER — MIDAZOLAM HCL 2 MG/2ML IJ SOLN
INTRAMUSCULAR | Status: DC | PRN
  Administered 2017-06-10 (×2): 1 mg via INTRAVENOUS

## 2017-06-10 MED ORDER — MIDAZOLAM HCL 2 MG/2ML IJ SOLN
INTRAMUSCULAR | Status: AC
  Filled 2017-06-10: qty 2

## 2017-06-10 MED ORDER — FENTANYL CITRATE (PF) 100 MCG/2ML IJ SOLN
100.0000 ug | Freq: Once | INTRAMUSCULAR | Status: AC
  Administered 2017-06-10: 100 ug via INTRAVENOUS
  Filled 2017-06-10: qty 2

## 2017-06-10 MED ORDER — PIPERACILLIN-TAZOBACTAM 3.375 G IVPB 30 MIN
3.3750 g | Freq: Once | INTRAVENOUS | Status: AC
  Administered 2017-06-10: 3.375 g via INTRAVENOUS
  Filled 2017-06-10: qty 50

## 2017-06-10 MED ORDER — ONDANSETRON 4 MG PO TBDP: 4.0000 mg | ORAL_TABLET | Freq: Four times a day (QID) | ORAL | Status: DC | PRN

## 2017-06-10 NOTE — Consult Note (Signed)
GI Inpatient Consult Note Kathline Magic, M.D.  Reason for Consult: Abdominal pain, choledocholithiasis, possible bile leak   Attending Requesting Consult: Celene Squibb (ED), Dr. Rosana Hoes (General surgery)  History of Present Illness: Roy Little is a 51 y.o. male who is postop day #4 status post laparoscopic cholecystectomy for acute cholecystitis. Patient had an apparently uneventful XCOPY cholecystectomy and was kept in the hospital for 2  days for IV antibiotics given the significant inflammatory changes of the gallbladder. The patient awoke this morning, 06/10/2017, with acute right upper quadrant pain and epigastric pain of an unbearable nature similar to the attack of cholecystitis. He attempted to eat but was unable to hold anything down. He denies any hematemesis, melena or hematochezia. He does complain of some subjective fever and chills, but is clearly afebrile here in the emergency room. He appears very uncomfortable and is lying down with both knees flexed towards his abdominal area. As mentioned below, abdominal ultrasound was obtained showing a distal common bile duct obstruction at the level ampulla. A 2 mm calculus was noted suggestive of retained gallstone in the bile duct. There also appeared to perihepatic ascites suggesting  possible biliary fluid.  In terms of habits, patient rarely drinks alcohol and does not take NSAIDs regularly. Serum lipase is normal at 22.   Past Medical History:  Past Medical History:  Diagnosis Date  . Cancer of lingual tonsil [facility])     Problem List: Patient Active Problem List   Diagnosis Date Noted  . Cholecystitis, acute with cholelithiasis 06/06/2017    Past Surgical History: Past Surgical History:  Procedure Laterality Date  . CHOLECYSTECTOMY N/A 06/06/2017   Procedure: LAPAROSCOPIC CHOLECYSTECTOMY WITH INTRAOPERATIVE CHOLANGIOGRAM;  Surgeon: Jules Husbands, MD;  Location: [facility] ORS;  Service: General;  Laterality: N/A;  .  KNEE SURGERY      Allergies: No Known Allergies  Home Medications:  (Not in a hospital admission) Home medication reconciliation was completed with the patient.   Scheduled Inpatient Medications:   . fentaNYL        Continuous Inpatient Infusions:    PRN Inpatient Medications:    Family History: family history includes Hypertension in his mother; Pancreatic cancer in his father.   GI Family History: Negative  Social History:   reports that  has never smoked. he has never used smokeless tobacco. He reports that he drinks alcohol. He reports that he does not use drugs. The patient denies ETOH, tobacco, or drug use.    Review of Systems: Review of Systems - Negative except Hypertension and GI ROS in the HPI.  Physical Examination: BP (!) 150/87   Pulse 69   Temp 97.7 F (36.5 C) (Oral)   Resp 18   Ht 5\' 9"  (1.753 m)   Wt 87.1 kg (192 lb)   SpO2 93%   BMI 28.35 kg/m  Physical Exam  HEENT: [place]/AT. PERRLA. Neck: Supple trachea midline. Chest: CTA no wheezes. CV: slightly tachy. No gallop Abd: Soft, mild guarding without rebound. BS hypoactive but present. No masses or obvious ascites.   Data: Lab Results  Component Value Date   WBC 9.2 06/10/2017   HGB 16.5 06/10/2017   HCT 49.0 06/10/2017   MCV 89.3 06/10/2017   PLT 358 06/10/2017   Recent Labs  Lab 06/05/17 2049 06/07/17 0628 06/10/17 1129  HGB 17.2 14.5 16.5   Lab Results  Component Value Date   NA 139 06/10/2017   K 3.7 06/10/2017   CL 100 (L) 06/10/2017  CO2 25 06/10/2017   BUN 14 06/10/2017   CREATININE 0.99 06/10/2017   Lab Results  Component Value Date   ALT 595 (H) 06/10/2017   AST 196 (H) 06/10/2017   ALKPHOS 318 (H) 06/10/2017   BILITOT 3.1 (H) 06/10/2017   No results for input(s): APTT, INR, PTT in the last 168 hours. CBC Latest Ref Rng & Units 06/10/2017 06/07/2017 06/05/2017  WBC 3.8 - 10.6 K/uL 9.2 10.7(H) 17.3(H)  Hemoglobin 13.0 - 18.0 g/dL 16.5 14.5 17.2  Hematocrit 40.0  - 52.0 % 49.0 40.6 50.5  Platelets 150 - 440 K/uL 358 278 368    STUDIES: Ct Abdomen Pelvis W Contrast  Addendum Date: 06/10/2017   ADDENDUM REPORT: 06/10/2017 15:08 ADDENDUM: The presence of ascites post cholecystectomy may represent postsurgical changes, however a bile leak cannot be excluded. Hepatobiliary imaging may be considered if bile leak is suspected clinically. Electronically Signed   By: Fidela Salisbury M.D.   On: 06/10/2017 15:08   Addendum Date: 06/10/2017   ADDENDUM REPORT: 06/10/2017 15:03 ADDENDUM: 2 mm calculus at the ampulla. No drastic pancreatic or biliary duct dilation. Electronically Signed   By: Fidela Salisbury M.D.   On: 06/10/2017 15:03   Result Date: 06/10/2017 CLINICAL DATA:  Severe abdominal pain. Post cholecystectomy 4 days ago. EXAM: CT ABDOMEN AND PELVIS WITH CONTRAST TECHNIQUE: Multidetector CT imaging of the abdomen and pelvis was performed using the standard protocol following bolus administration of intravenous contrast. CONTRAST:  17mL ISOVUE-300 IOPAMIDOL (ISOVUE-300) INJECTION 61% COMPARISON:  06/06/2017 FINDINGS: Lower chest: No acute abnormality. Hepatobiliary: Normal appearance of the liver. Small amount of fluid within the cholecystectomy bed, as well as free fluid surrounding the liver with water density. Pancreas: Unremarkable. No pancreatic ductal dilatation or surrounding inflammatory changes. Spleen: Normal in size without focal abnormality. Adrenals/Urinary Tract: Adrenal glands are unremarkable. Kidneys are normal, without renal calculi, focal lesion, or hydronephrosis. Bladder is unremarkable. Stomach/Bowel: Stomach is within normal limits. Appendix appears normal. No evidence of bowel wall thickening, distention, or inflammatory changes. Left colonic diverticulosis. Vascular/Lymphatic: No significant vascular findings. 13 mm lymph node in porta hepaticus, nonspecific. Reproductive: Prostate is unremarkable. Other: Small amount water density  free fluid in the abdomen and pelvis. Musculoskeletal: No acute or significant osseous findings. Postsurgical changes from laparoscopic cholecystectomy in the anterior abdominal wall. IMPRESSION: Post recent cholecystectomy with small amount of fluid in the cholecystectomy bed. Small to moderate amount of free fluid in the abdomen and pelvis. No evidence of acute abnormality within the solid abdominal organs. Electronically Signed: By: Fidela Salisbury M.D. On: 06/10/2017 12:44   US Abdomen Limited Ruq  Result Date: 06/10/2017 CLINICAL DATA:  Right upper quadrant pain since this morning. Recent cholecystectomy. EXAM: ULTRASOUND ABDOMEN LIMITED RIGHT UPPER QUADRANT COMPARISON:  Abdominal CT 06/10/2017. Intraoperative cholangiogram 06/06/2017 FINDINGS: Gallbladder: Gallbladder has been surgically removed. There is a small irregular fluid collection in the gallbladder fossa, measuring roughly 3 cm. Common bile duct: Diameter: 0.9 cm. Review of the CT from 06/10/2017 demonstrates a small calcification near the distal common bile duct and ampulla. This may be accounting for the mild biliary dilatation. Liver: Small amount of perihepatic ascites. Echogenicity in the liver parenchyma is within normal limits. Main portal vein is patent with normal directional flow towards the liver. There may be mild intrahepatic biliary dilatation. Other: Small amount ascites in the right lower quadrant and left lower quadrant. Small amount of ascites in the left upper quadrant. IMPRESSION: Post cholecystectomy with a small amount of ascites throughout  the abdomen. The ascites raises concern for a possible bile leak. In addition, there is mild biliary dilatation and concern for a stone near the distal common bile duct based on the recent CT. Consider further characterization with a nuclear medicine hepatobiliary examination and/or GI consultation with ERCP. Small amount of fluid in the gallbladder fossa could represent normal  postoperative changes. These results were called by telephone at the time of interpretation on 06/10/2017 at 3:07 pm to Dr. Rudene Re , who verbally acknowledged these results. Electronically Signed   By: Markus Daft M.D.   On: 06/10/2017 15:08   @IMAGES @  Assessment: 1. Acute, severe abdominal pain - likely secondary to one, both or a combination of distal common bile duct obstruction and bile leak.  2. S/P laparoscopic cholecystectomy 06/08/17 - Dr. Dahlia Byes. Dr. Rosana Hoes of his group admitting this evening.  Recommendations: 1. IV antibiotics, maintain NPO status. 2. ERCP with bilary stone extraction, possible sphincterotomy, possible bilary stent placement. The patient understands the nature of the planned procedure, indications, risks, alternatives and potential complications including but not limited to bleeding, infection, perforation, post-ERCP pancreatitis (5-10%), damage to internal organs and possible oversedation/side effects from anesthesia. The patient agrees and gives consent to proceed.  Please refer to procedure notes for findings, recommendations and patient disposition/instructions.  Case discussed with Dr.Wohl who will be the gastroenterologist performing the ERCP.   Thank you for the consult. Please call with questions or concerns.  Olean Ree, MD  06/10/2017 4:27 PM

## 2017-06-10 NOTE — ED Notes (Signed)
Dr Alice Reichert in with pt and family now.  Pain meds given.

## 2017-06-10 NOTE — Anesthesia Preprocedure Evaluation (Addendum)
Anesthesia Evaluation  Patient identified by MRN, date of birth, ID band Patient awake    Reviewed: Allergy & Precautions, H&P , NPO status , Patient's Chart, lab work & pertinent test results  History of Anesthesia Complications (+) PONV and history of anesthetic complications  Airway Mallampati: II  TM Distance: >3 FB Neck ROM: full    Dental  (+) Chipped   Pulmonary neg pulmonary ROS, neg shortness of breath,           Cardiovascular Exercise Tolerance: Good (-) angina(-) Past MI and (-) DOE negative cardio ROS       Neuro/Psych negative neurological ROS  negative psych ROS   GI/Hepatic negative GI ROS, Neg liver ROS, neg GERD  ,  Endo/Other  negative endocrine ROS  Renal/GU negative Renal ROS  negative genitourinary   Musculoskeletal   Abdominal   Peds  Hematology negative hematology ROS (+)   Anesthesia Other Findings Patient is NPO appropriate and reports no vomiting today.   Past Medical History: No date: Cancer of lingual tonsil [facility])  Past Surgical History: 06/06/2017: CHOLECYSTECTOMY; N/A     Comment:  Procedure: LAPAROSCOPIC CHOLECYSTECTOMY WITH               INTRAOPERATIVE CHOLANGIOGRAM;  Surgeon: Jules Husbands,               MD;  Location: [facility] ORS;  Service: General;  Laterality:               N/A; No date: KNEE SURGERY  BMI    Body Mass Index:  28.35 kg/m      Reproductive/Obstetrics negative OB ROS                             Anesthesia Physical Anesthesia Plan  ASA: II  Anesthesia Plan: General   Post-op Pain Management:    Induction: Intravenous  PONV Risk Score and Plan: Midazolam, TIVA and Propofol infusion  Airway Management Planned: Natural Airway and Nasal Cannula  Additional Equipment:   Intra-op Plan:   Post-operative Plan:   Informed Consent: I have reviewed the patients History and Physical, chart, labs and discussed the procedure  including the risks, benefits and alternatives for the proposed anesthesia with the patient or authorized representative who has indicated his/her understanding and acceptance.   Dental Advisory Given  Plan Discussed with: Anesthesiologist, CRNA and Surgeon  Anesthesia Plan Comments: (Patient and wife consented  They were consented for risks of anesthesia including but not limited to:  - adverse reactions to medications - risk of intubation if required - damage to teeth, lips or other oral mucosa - sore throat or hoarseness - Damage to heart, brain, lungs or loss of life  They voiced understanding)       Anesthesia Quick Evaluation

## 2017-06-10 NOTE — Anesthesia Post-op Follow-up Note (Signed)
Anesthesia QCDR form completed.        

## 2017-06-10 NOTE — ED Notes (Signed)
Report given to penny rn in endo.

## 2017-06-10 NOTE — ED Notes (Signed)
Pt to endo.  Report called to penny rn in endo.

## 2017-06-10 NOTE — ED Notes (Signed)
Resumed care from [employer].  Pt alert.  Iv fluids infused.  Family with pt..  Skin warm and dry.

## 2017-06-10 NOTE — Interval H&P Note (Signed)
History and Physical Interval Note:  06/10/2017 6:14 PM  Roy Little  has presented today for surgery, with the diagnosis of Bile duct leak  The various methods of treatment have been discussed with the patient and family. After consideration of risks, benefits and other options for treatment, the patient has consented to  Procedure(s): ENDOSCOPIC RETROGRADE CHOLANGIOPANCREATOGRAPHY (ERCP) (N/A) as a surgical intervention .  The patient's history has been reviewed, patient examined, no change in status, stable for surgery.  I have reviewed the patient's chart and labs.  Questions were answered to the patient's satisfaction.     Carrell Palmatier [employer]

## 2017-06-10 NOTE — ED Provider Notes (Addendum)
[facility] Emergency Department Provider Note  ____________________________________________  Time seen: Approximately 1:28 PM  I have reviewed the triage vital signs and the nursing notes.   HISTORY  Chief Complaint Post-op Problem   HPI Roy Little is a 51 y.o. male POD 4 from cholecystectomy presents for evaluation of abdominal pain. Patient reports that the pain started yesterday evening after he ate and this morning at 4:30 AM the pain became severe. He reports that the pain is identical to his recent episode of cholecystitis. He is complaining of 10 out of 10 sharp pain located in the right upper quadrant epigastric region, constant and nonradiating. He has had nausea and chills, no vomiting, no diarrhea or dysuria, no chest pain, no cough, no shortness of breath. Patient denies fevers at home. Patient endorses having normal bowel movement this morning. He is not currently on antibiotics.  Past Medical History:  Diagnosis Date  . 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 Cholecystitis, acute with cholelithiasis 06/06/2017    Past Surgical History:  Procedure Laterality Date  . CHOLECYSTECTOMY N/A 06/06/2017   Procedure: LAPAROSCOPIC CHOLECYSTECTOMY WITH INTRAOPERATIVE CHOLANGIOGRAM;  Surgeon: Jules Husbands, MD;  Location: [facility] ORS;  Service: General;  Laterality: N/A;  . KNEE SURGERY      Prior to Admission medications   Medication Sig Start Date End Date Taking? Authorizing Provider  amLODipine (NORVASC) 5 MG tablet Take 1 tablet (5 mg total) by mouth daily. 06/08/17  Yes Pabon, [place], MD  finasteride (PROSCAR) 5 MG tablet Take 1 tablet by mouth daily. 04/29/17  Yes [provider]  HYDROcodone-acetaminophen (NORCO/VICODIN) 5-325 MG tablet Take 1-2 tablets by mouth every 6 (six) hours as needed for moderate pain. 06/08/17  Yes Pabon, Diego F, MD  metoprolol tartrate (LOPRESSOR) 25  MG tablet Take 1 tablet (25 mg total) by mouth 2 (two) times daily. 06/08/17  Yes Pabon, Marjory Lies, MD    Allergies Patient has no known allergies.  Family History  Problem Relation Age of Onset  . Hypertension Mother   . Pancreatic cancer Father     Social History Social History   Tobacco Use  . Smoking status: Never Smoker  . Smokeless tobacco: Never Used  Substance Use Topics  . Alcohol use: Yes  . Drug use: No    Review of Systems  Constitutional: Negative for fever. + chills Eyes: Negative for visual changes. ENT: Negative for sore throat. Neck: No neck pain  Cardiovascular: Negative for chest pain. Respiratory: Negative for shortness of breath. Gastrointestinal: + adominal pain, nausea. No vomiting or diarrhea. Genitourinary: Negative for dysuria. Musculoskeletal: Negative for back pain. Skin: Negative for rash. Neurological: Negative for headaches, weakness or numbness. Psych: No SI or HI  ____________________________________________   PHYSICAL EXAM:  VITAL SIGNS: ED Triage Vitals  Enc Vitals Group     BP 06/10/17 1125 (!) 176/102     Pulse Rate 06/10/17 1125 76     Resp 06/10/17 1125 (!) 26     Temp 06/10/17 1120 97.7 F (36.5 C)     Temp Source 06/10/17 1120 Oral     SpO2 06/10/17 1125 98 %     Weight 06/10/17 1121 192 lb (87.1 kg)     Height 06/10/17 1121 5\' 9"  (1.753 m)     Head Circumference --      Peak Flow --      Pain Score 06/10/17  1120 10     Pain Loc --      Pain Edu? --      Excl. in [place]? --     Constitutional: Alert and oriented, patient is in mild distress due to pain.  HEENT:      Head: Normocephalic and atraumatic.         Eyes: Conjunctivae are normal. Sclera is non-icteric.       Mouth/Throat: Mucous membranes are moist.       Neck: Supple with no signs of meningismus. Cardiovascular: Regular rate and rhythm. No murmurs, gallops, or rubs. 2+ symmetrical distal pulses are present in all extremities. No JVD. Respiratory:  Normal respiratory effort. Lungs are clear to auscultation bilaterally. No wheezes, crackles, or rhonchi.  Gastrointestinal: Soft, diffuse tenderness to palpation on the right upper quadrant epigastric region with localized guarding, and non distended with positive bowel sounds. No rebound. Genitourinary: No CVA tenderness. Musculoskeletal: Nontender with normal range of motion in all extremities. No edema, cyanosis, or erythema of extremities. Neurologic: Normal speech and language. Face is symmetric. Moving all extremities. No gross focal neurologic deficits are appreciated. Skin: Skin is warm, dry and intact. No rash noted. Psychiatric: Mood and affect are normal. Speech and behavior are normal.  ____________________________________________   LABS (all labs ordered are listed, but only abnormal results are displayed)  Labs Reviewed  CBC WITH DIFFERENTIAL/PLATELET - Abnormal; Notable for the following components:      Result Value   Neutro Abs 7.2 (*)    All other components within normal limits  COMPREHENSIVE METABOLIC PANEL - Abnormal; Notable for the following components:   Chloride 100 (*)    Glucose, Bld 148 (*)    AST 196 (*)    ALT 595 (*)    Alkaline Phosphatase 318 (*)    Total Bilirubin 3.1 (*)    All other components within normal limits  LACTIC ACID, PLASMA - Abnormal; Notable for the following components:   Lactic Acid, Venous 2.2 (*)    All other components within normal limits  URINALYSIS, COMPLETE (UACMP) WITH MICROSCOPIC - Abnormal; Notable for the following components:   Color, Urine YELLOW (*)    APPearance CLEAR (*)    Specific Gravity, Urine 1.034 (*)    All other components within normal limits  BILIRUBIN, DIRECT - Abnormal; Notable for the following components:   Bilirubin, Direct 1.5 (*)    All other components within normal limits  LIPASE, BLOOD   ____________________________________________  EKG  ED ECG REPORT I, Rudene Re, the attending  physician, personally viewed and interpreted this ECG.  Normal sinus rhythm, rate of 73, normal intervals, normal axis, no ST elevations or depressions. ____________________________________________  RADIOLOGY  CT a/P: Post recent cholecystectomy with small amount of fluid in the cholecystectomy bed. Small to moderate amount of free fluid in the abdomen and pelvis. No evidence of acute abnormality within the solid abdominal organs.   RUQ [place]: Post cholecystectomy with a small amount of ascites throughout the abdomen. The ascites raises concern for a possible bile leak. In addition, there is mild biliary dilatation and concern for a stone near the distal common bile duct based on the recent CT. Consider further characterization with a nuclear medicine hepatobiliary examination and/or GI consultation with ERCP.  Small amount of fluid in the gallbladder fossa could represent normal postoperative changes. ____________________________________________   PROCEDURES  Procedure(s) performed: None Procedures Critical Care performed: yes  CRITICAL CARE Performed by: Rudene Re  ?  Total critical  care time: 40 min  Critical care time was exclusive of separately billable procedures and treating other patients.  Critical care was necessary to treat or prevent imminent or life-threatening deterioration.  Critical care was time spent personally by me on the following activities: development of treatment plan with patient and/or surrogate as well as nursing, discussions with consultants, evaluation of patient's response to treatment, examination of patient, obtaining history from patient or surrogate, ordering and performing treatments and interventions, ordering and review of laboratory studies, ordering and review of radiographic studies, pulse oximetry and re-evaluation of patient's condition.  ____________________________________________   INITIAL IMPRESSION / ASSESSMENT AND PLAN /  ED COURSE  51 y.o. male POD 4 from cholecystectomy presents for evaluation of abdominal pain since last night. patient looks uncomfortable and in mild distress due to pain, his vital signs are within normal limits, patient is afebrile, his abdomen is soft with a diffuse tenderness to palpation on the right upper quadrant and epigastric region with localized guarding. No rebound.labs showing AST of 196, ALT of 595, alkaline phosphatase is 318, and T bili of 3.1, normal WBC. Lactate elevated at 2.2. Ddx retained stone, post-complication such as intra-abdominal infection, abscess, seroma, pancreatitis. CT a/p with no evidence of abscess. [place] pending. Zosyn, fentanyl, zofran, and IVF given    _________________________ 3:35 PM on 06/10/2017 -----------------------------------------  ultrasound concerning for choledocholithiasis. Patient remains hemodynamically stable. Normal WBC, no fever or tachycardia but slightly elevated lactic acid. IVF and abx given. Discussed with Dr. Alice Reichert, GI and Dr. Rosana Hoes, surgery for admission. Patient and wife updated of results and plan.   As part of my medical decision making, I reviewed the following data within the [facility] notes reviewed and incorporated, Labs reviewed , Old chart reviewed, Radiograph reviewed , Discussed with admitting physician , A consult was requested and obtained from this/these consultant(s) GI and Surgery, Notes from prior ED visits and [place] Controlled Substance Database    Pertinent labs & imaging results that were available during my care of the patient were reviewed by me and considered in my medical decision making (see chart for details).    ____________________________________________   FINAL CLINICAL IMPRESSION(S) / ED DIAGNOSES  Final diagnoses:  RUQ abdominal pain  Choledocholithiasis  Lactic acidosis      NEW MEDICATIONS STARTED DURING THIS VISIT:  ED Discharge Orders    None       Note:   This document was prepared using Dragon voice recognition software and may include unintentional dictation errors.    Rudene Re, MD 06/10/17 [place], [place], MD 06/10/17 1540

## 2017-06-10 NOTE — Anesthesia Postprocedure Evaluation (Signed)
Anesthesia Post Note  Patient: Roy Little  Procedure(s) Performed: LAPAROSCOPIC CHOLECYSTECTOMY WITH INTRAOPERATIVE CHOLANGIOGRAM (N/A Abdomen)  Patient location during evaluation: PACU Anesthesia Type: General Level of consciousness: awake and alert Pain management: pain level controlled Vital Signs Assessment: post-procedure vital signs reviewed and stable Respiratory status: spontaneous breathing, nonlabored ventilation, respiratory function stable and patient connected to nasal cannula oxygen Cardiovascular status: blood pressure returned to baseline and stable Postop Assessment: no apparent nausea or vomiting Anesthetic complications: no     Last Vitals:  Vitals:   06/08/17 0427 06/08/17 1007  BP: (!) 150/94 (!) 165/103  Pulse: 82 83  Resp: 20 18  Temp: 36.7 C 36.5 C  SpO2: 97% 97%    Last Pain:  Vitals:   06/08/17 0637  TempSrc:   PainSc: Asleep                 Molli Barrows

## 2017-06-10 NOTE — Op Note (Addendum)
[facility] Gastroenterology Patient Name: Roy Little Procedure Date: 06/10/2017 5:37 PM MRN: [id number] Account #: [id number] Date of Birth: 1966/07/14 Admit Type: Inpatient Age: 51 Room: [facility] ENDO ROOM 4 Gender: Male Note Status: Finalized Procedure:            ERCP Indications:          Abdominal pain of suspected biliary origin, Bile duct                        stone(s), Suspected bile leak, Jaundice Providers:            Lucilla Lame MD, MD Medicines:            Propofol per Anesthesia Complications:        No immediate complications. Procedure:            Pre-Anesthesia Assessment:                       - Prior to the procedure, a History and Physical was                        performed, and patient medications and allergies were                        reviewed. The patient's tolerance of previous                        anesthesia was also reviewed. The risks and benefits of                        the procedure and the sedation options and risks were                        discussed with the patient. All questions were                        answered, and informed consent was obtained. Prior                        Anticoagulants: The patient has taken no previous                        anticoagulant or antiplatelet agents. ASA Grade                        Assessment: II - A patient with mild systemic disease.                        After reviewing the risks and benefits, the patient was                        deemed in satisfactory condition to undergo the                        procedure.                       After obtaining informed consent, the scope was passed                        under direct  vision. Throughout the procedure, the                        patient's blood pressure, pulse, and oxygen saturations                        were monitored continuously. The ERCP was introduced                        through the mouth, and used to inject  contrast into and                        used to inject contrast into the bile duct. The ERCP                        was accomplished without difficulty. The patient                        tolerated the procedure well. Findings:      A scout film of the abdomen was obtained. Surgical clips, consistent       with previous cholecystectomy, were seen in the area of the cystic duct.       The esophagus was successfully intubated under direct vision. The scope       was advanced to a normal major papilla in the descending duodenum       without detailed examination of the pharynx, larynx and associated       structures, and upper GI tract. The upper GI tract was grossly normal.       The bile duct was deeply cannulated with the short-nosed traction       sphincterotome. Contrast was injected. I personally interpreted the bile       duct images. There was brisk flow of contrast through the ducts. Image       quality was excellent. Contrast extended to the hepatic ducts.       Extravasation of contrast originating from the cystic duct was observed.       A wire was passed into the biliary tree. A 10 mm biliary sphincterotomy       was made with a traction (standard) sphincterotome using ERBE       electrocautery. There was no post-sphincterotomy bleeding. The biliary       tree was swept with a 15 mm balloon starting at the bifurcation. One       stone was removed. No stones remained. One 10 Fr by 7 cm plastic stent       with a single external flap and a single internal flap was placed 6 cm       into the common bile duct. Bile flowed through the stent. The stent was       in good position. Impression:           - A bile leak was found.                       - Choledocholithiasis was found. Complete removal was                        accomplished by biliary sphincterotomy and balloon  extraction.                       - A biliary sphincterotomy was performed.                        - The biliary tree was swept.                       - One plastic stent was placed into the common bile                        duct. Recommendation:       - Admit the patient to hospital ward for ongoing care.                       - Watch for pancreatitis, bleeding, perforation, and                        cholangitis.                       - Repeat ERCP in 2 months to remove stent. Procedure Code(s):    --- Professional ---                       [phone], Endoscopic retrograde cholangiopancreatography                        (ERCP); with placement of endoscopic stent into biliary                        or pancreatic duct, including pre- and post-dilation                        and guide wire passage, when performed, including                        sphincterotomy, when performed, each stent                       [id number], Endoscopic retrograde cholangiopancreatography                        (ERCP); with removal of calculi/debris from                        biliary/pancreatic duct(s)                       44034, Endoscopic catheterization of the biliary ductal                        system, radiological supervision and interpretation Diagnosis Code(s):    --- Professional ---                       R10.9, Unspecified abdominal pain                       K83.9, Disease of biliary tract, unspecified                       R17, Unspecified jaundice  K80.50, Calculus of bile duct without cholangitis or                        cholecystitis without obstruction CPT copyright 2016 [employer]. All rights reserved. The codes documented in this report are preliminary and upon coder review may  be revised to meet current compliance requirements. Lucilla Lame MD, MD 06/10/2017 6:22:02 PM This report has been signed electronically. Number of Addenda: 0 Note Initiated On: 06/10/2017 5:37 PM      [facility]

## 2017-06-10 NOTE — H&P (Signed)
SURGICAL HISTORY & PHYSICAL (cpt [phone])  HISTORY OF PRESENT ILLNESS (HPI):  51 y.o. Little presented to [facility] ED today for abdominal pain. Patient reports he underwent laparoscopic cholecystectomy 4 days ago for acute cholecystitis, after which he was discharged from the hospital 2 days later following IV antibiotics x 2 days due to the degree of inflammation encountered intraoperatively. Prior to discharge, bilirubin was elevated to 2.1, while minimal serous fluid was draining from surgically placed JP prior to removal of the drain. Patient reports he awoke this morning with severe RUQ abdominal pain, associated with nausea and non-bloody emesis after he attempted to eat. He otherwise describes subjective fever/chills with +flatus and +BM, denies CP or SOB. While in the ED, patient says his pain has been somewhat controlled, but remains uncomfortable.  PAST MEDICAL HISTORY (PMH):  Past Medical History:  Diagnosis Date  . 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 Otherwise negative.   PAST SURGICAL HISTORY ([place]):  Past Surgical History:  Procedure Laterality Date  . CHOLECYSTECTOMY N/A 06/06/2017   Procedure: LAPAROSCOPIC CHOLECYSTECTOMY WITH INTRAOPERATIVE CHOLANGIOGRAM;  Surgeon: Jules Husbands, MD;  Location: [facility] ORS;  Service: General;  Laterality: N/A;  . KNEE SURGERY      Reviewed. Otherwise negative.   MEDICATIONS:  Prior to Admission medications   Medication Sig Start Date End Date Taking? Authorizing Provider  amLODipine (NORVASC) 5 MG tablet Take 1 tablet (5 mg total) by mouth daily. 06/08/17  Yes Pabon, [place], MD  finasteride (PROSCAR) 5 MG tablet Take 1 tablet by mouth daily. 04/29/17  Yes [provider]  HYDROcodone-acetaminophen (NORCO/VICODIN) 5-325 MG tablet Take 1-2 tablets by mouth every 6 (six) hours as needed for moderate pain. 06/08/17  Yes Pabon, Diego F, MD  metoprolol tartrate (LOPRESSOR) 25 MG tablet Take 1 tablet (25 mg total) by mouth 2 (two)  times daily. 06/08/17  Yes Pabon, Marjory Lies, MD     ALLERGIES:  No Known Allergies   SOCIAL HISTORY:  Social History   Socioeconomic History  . Marital status: Married    Spouse name: Not on file  . Number of children: Not on file  . Years of education: Not on file  . Highest education level: Not on file  Social Needs  . Financial resource strain: Not on file  . Food insecurity - worry: Not on file  . Food insecurity - inability: Not on file  . Transportation needs - medical: Not on file  . Transportation needs - non-medical: Not on file  Occupational History  . Not on file  Tobacco Use  . Smoking status: Never Smoker  . Smokeless tobacco: Never Used  Substance and Sexual Activity  . Alcohol use: Yes  . Drug use: No  . Sexual activity: Yes  Other Topics Concern  . Not on file  Social History Narrative  . Not on file    The patient currently resides (home / rehab facility / nursing home): Home The patient normally is (ambulatory / bedbound): Ambulatory  FAMILY HISTORY:  Family History  Problem Relation Age of Onset  . Hypertension Mother   . Pancreatic cancer Father     Otherwise negative.   REVIEW OF SYSTEMS:  Constitutional: denies any other weight loss, fever, chills, or sweats  Eyes: denies any other vision changes, history of eye injury  ENT: denies sore throat, hearing problems  Respiratory: denies shortness of breath, wheezing  Cardiovascular: denies chest pain, palpitations  Gastrointestinal: abdominal pain, N/V, and bowel function  as per HPI  Genitourinary: denies burning with urination or urinary frequency Musculoskeletal: denies any other joint pains or cramps  Skin: Denies any other rashes or skin discolorations  Neurological: denies any other headache, dizziness, weakness  Psychiatric: denies any other depression, anxiety   All other review of systems were otherwise negative.  VITAL SIGNS:  Temp:  [97.7 F (36.5 C)] 97.7 F (36.5 C) (11/27  1120) Pulse Rate:  [64-82] 82 (11/27 1620) Resp:  [16-26] 21 (11/27 1620) BP: (143-176)/(85-105) 174/105 (11/27 1620) SpO2:  [93 %-100 %] 93 % (11/27 1620) Weight:  [192 lb (87.1 kg)] 192 lb (87.1 kg) (11/27 1121)     Height: 5\' 9"  (175.3 cm) Weight: 192 lb (87.1 kg) BMI (Calculated): 28.34   INTAKE/OUTPUT:  This shift: Total I/O In: 1050 [IV Piggyback:1050] Out: -   Last 2 shifts: @IOLAST2SHIFTS @  PHYSICAL EXAM:  Constitutional:  -- Normal body habitus  -- Awake, alert, and oriented x3, appears uncomfortable Eyes:  -- Pupils equally round and reactive to light  -- No scleral icterus, B/L no occular discharge Ear, nose, throat: -- Neck is FROM WNL -- No jugular venous distension  Pulmonary:  -- No wheezes or rhales -- Equal breath sounds bilaterally -- Breathing non-labored at rest Cardiovascular:  -- S1, S2 present  -- No pericardial rubs  Gastrointestinal:  -- Abdomen soft and non-distended with moderately severe epigastric and RUQ abdominal tenderness to palpation, no guarding or rebound tenderness, post-surgical incisions well-approximated without erythema or drainage -- No abdominal masses appreciated, pulsatile or otherwise  Musculoskeletal and Integumentary:  -- Wounds or skin discoloration: None appreciated except as described above (GI) -- Extremities: B/L UE and LE FROM, hands and feet warm, no edema  Neurologic:  -- Motor function: Intact and symmetric -- Sensation: Intact and symmetric Psychiatric:  -- Mood and affect WNL  Labs:  CBC Latest Ref Rng & Units 06/10/2017 06/07/2017 06/05/2017  WBC 3.8 - 10.6 K/uL 9.2 10.7(H) 17.3(H)  Hemoglobin 13.0 - 18.0 g/dL 16.5 14.5 17.2  Hematocrit 40.0 - 52.0 % 49.0 40.6 50.5  Platelets 150 - 440 K/uL 358 278 368   CMP Latest Ref Rng & Units 06/10/2017 06/07/2017 06/05/2017  Glucose 65 - 99 mg/dL 148(H) 129(H) 143(H)  BUN 6 - 20 mg/dL 14 11 17   Creatinine 0.61 - 1.24 mg/dL 0.99 0.90 0.91  Sodium 135 - 145 mmol/L 139  139 140  Potassium 3.5 - 5.1 mmol/L 3.7 3.5 3.5  Chloride 101 - 111 mmol/L 100(L) 106 101  CO2 22 - 32 mmol/L 25 25 27   Calcium 8.9 - 10.3 mg/dL 9.1 8.3(L) 9.1  Total Protein 6.5 - 8.1 g/dL 7.5 6.2(L) 7.9  Total Bilirubin 0.3 - 1.2 mg/dL 3.1(H) 2.1(H) 1.2  Alkaline Phos 38 - 126 U/L 318(H) 117 131(H)  AST 15 - 41 U/L 196(H) 255(H) 134(H)  ALT 17 - 63 U/L 595(H) 375(H) 100(H)   Imaging studies:  Limited RUQ Abdominal Ultrasound (06/10/2017) - personally reviewed and discussed with patient and his wife Post cholecystectomy with a small amount of ascites throughout the abdomen. The ascites raises concern for a possible bile leak. In addition, there is mild biliary dilatation and concern for a stone near the distal common bile duct based on the recent CT. Consider further characterization with a nuclear medicine hepatobiliary examination and/or GI consultation with ERCP.  Small amount of fluid in the gallbladder fossa could represent normal postoperative changes.  CT Abdomen and Pelvis with Contrast (06/10/2017) - personally reviewed and discussed  with patient and his wife 2 mm calculus at the ampulla. No drastic pancreatic or biliary duct dilation. Small amount of fluid within the cholecystectomy bed, as well  as free fluid surrounding the liver with water density.The presence of  ascites post cholecystectomy may represent postsurgical  changes, however a bile leak cannot be excluded.  Assessment/Plan: (ICD-10's: K31.50) 51 y.o. Little with choledocholithiasis 4 days following laparoscopic cholecystectomy for acute cholecystitis, complicated by perihepatic ascites consistent with post-surgical changes vs bile leak (potentially secondary to retained CBD stone).    - NPO, IVF   - IV antibiotics  - pain control prn  - will admit for ERCP   - DVT prophylaxis  All of the above findings and recommendations were discussed with the patient, his wife, Dr. Alice Reichert and Dr. Allen Norris (GI), and all  of patient's and his family's questions were answered to their expressed satisfaction.  -- Marilynne Drivers Rosana Hoes, MD, [place]: [facility] - Partnering for exceptional care. Office: [phone]

## 2017-06-10 NOTE — H&P (View-Only) (Signed)
GI Inpatient Consult Note Kathline Magic, M.D.  Reason for Consult: Abdominal pain, choledocholithiasis, possible bile leak   Attending Requesting Consult: Celene Squibb (ED), Dr. Rosana Hoes (General surgery)  History of Present Illness: Roy Little is a 51 y.o. male who is postop day #4 status post laparoscopic cholecystectomy for acute cholecystitis. Patient had an apparently uneventful XCOPY cholecystectomy and was kept in the hospital for 2  days for IV antibiotics given the significant inflammatory changes of the gallbladder. The patient awoke this morning, 06/10/2017, with acute right upper quadrant pain and epigastric pain of an unbearable nature similar to the attack of cholecystitis. He attempted to eat but was unable to hold anything down. He denies any hematemesis, melena or hematochezia. He does complain of some subjective fever and chills, but is clearly afebrile here in the emergency room. He appears very uncomfortable and is lying down with both knees flexed towards his abdominal area. As mentioned below, abdominal ultrasound was obtained showing a distal common bile duct obstruction at the level ampulla. A 2 mm calculus was noted suggestive of retained gallstone in the bile duct. There also appeared to perihepatic ascites suggesting  possible biliary fluid.  In terms of habits, patient rarely drinks alcohol and does not take NSAIDs regularly. Serum lipase is normal at 22.   Past Medical History:  Past Medical History:  Diagnosis Date  . Cancer of lingual tonsil [facility])     Problem List: Patient Active Problem List   Diagnosis Date Noted  . Cholecystitis, acute with cholelithiasis 06/06/2017    Past Surgical History: Past Surgical History:  Procedure Laterality Date  . CHOLECYSTECTOMY N/A 06/06/2017   Procedure: LAPAROSCOPIC CHOLECYSTECTOMY WITH INTRAOPERATIVE CHOLANGIOGRAM;  Surgeon: Jules Husbands, MD;  Location: [facility] ORS;  Service: General;  Laterality: N/A;  .  KNEE SURGERY      Allergies: No Known Allergies  Home Medications:  (Not in a hospital admission) Home medication reconciliation was completed with the patient.   Scheduled Inpatient Medications:   . fentaNYL        Continuous Inpatient Infusions:    PRN Inpatient Medications:    Family History: family history includes Hypertension in his mother; Pancreatic cancer in his father.   GI Family History: Negative  Social History:   reports that  has never smoked. he has never used smokeless tobacco. He reports that he drinks alcohol. He reports that he does not use drugs. The patient denies ETOH, tobacco, or drug use.    Review of Systems: Review of Systems - Negative except Hypertension and GI ROS in the HPI.  Physical Examination: BP (!) 150/87   Pulse 69   Temp 97.7 F (36.5 C) (Oral)   Resp 18   Ht 5\' 9"  (1.753 m)   Wt 87.1 kg (192 lb)   SpO2 93%   BMI 28.35 kg/m  Physical Exam  HEENT: [place]/AT. PERRLA. Neck: Supple trachea midline. Chest: CTA no wheezes. CV: slightly tachy. No gallop Abd: Soft, mild guarding without rebound. BS hypoactive but present. No masses or obvious ascites.   Data: Lab Results  Component Value Date   WBC 9.2 06/10/2017   HGB 16.5 06/10/2017   HCT 49.0 06/10/2017   MCV 89.3 06/10/2017   PLT 358 06/10/2017   Recent Labs  Lab 06/05/17 2049 06/07/17 0628 06/10/17 1129  HGB 17.2 14.5 16.5   Lab Results  Component Value Date   NA 139 06/10/2017   K 3.7 06/10/2017   CL 100 (L) 06/10/2017  CO2 25 06/10/2017   BUN 14 06/10/2017   CREATININE 0.99 06/10/2017   Lab Results  Component Value Date   ALT 595 (H) 06/10/2017   AST 196 (H) 06/10/2017   ALKPHOS 318 (H) 06/10/2017   BILITOT 3.1 (H) 06/10/2017   No results for input(s): APTT, INR, PTT in the last 168 hours. CBC Latest Ref Rng & Units 06/10/2017 06/07/2017 06/05/2017  WBC 3.8 - 10.6 K/uL 9.2 10.7(H) 17.3(H)  Hemoglobin 13.0 - 18.0 g/dL 16.5 14.5 17.2  Hematocrit 40.0  - 52.0 % 49.0 40.6 50.5  Platelets 150 - 440 K/uL 358 278 368    STUDIES: Ct Abdomen Pelvis W Contrast  Addendum Date: 06/10/2017   ADDENDUM REPORT: 06/10/2017 15:08 ADDENDUM: The presence of ascites post cholecystectomy may represent postsurgical changes, however a bile leak cannot be excluded. Hepatobiliary imaging may be considered if bile leak is suspected clinically. Electronically Signed   By: Fidela Salisbury M.D.   On: 06/10/2017 15:08   Addendum Date: 06/10/2017   ADDENDUM REPORT: 06/10/2017 15:03 ADDENDUM: 2 mm calculus at the ampulla. No drastic pancreatic or biliary duct dilation. Electronically Signed   By: Fidela Salisbury M.D.   On: 06/10/2017 15:03   Result Date: 06/10/2017 CLINICAL DATA:  Severe abdominal pain. Post cholecystectomy 4 days ago. EXAM: CT ABDOMEN AND PELVIS WITH CONTRAST TECHNIQUE: Multidetector CT imaging of the abdomen and pelvis was performed using the standard protocol following bolus administration of intravenous contrast. CONTRAST:  18mL ISOVUE-300 IOPAMIDOL (ISOVUE-300) INJECTION 61% COMPARISON:  06/06/2017 FINDINGS: Lower chest: No acute abnormality. Hepatobiliary: Normal appearance of the liver. Small amount of fluid within the cholecystectomy bed, as well as free fluid surrounding the liver with water density. Pancreas: Unremarkable. No pancreatic ductal dilatation or surrounding inflammatory changes. Spleen: Normal in size without focal abnormality. Adrenals/Urinary Tract: Adrenal glands are unremarkable. Kidneys are normal, without renal calculi, focal lesion, or hydronephrosis. Bladder is unremarkable. Stomach/Bowel: Stomach is within normal limits. Appendix appears normal. No evidence of bowel wall thickening, distention, or inflammatory changes. Left colonic diverticulosis. Vascular/Lymphatic: No significant vascular findings. 13 mm lymph node in porta hepaticus, nonspecific. Reproductive: Prostate is unremarkable. Other: Small amount water density  free fluid in the abdomen and pelvis. Musculoskeletal: No acute or significant osseous findings. Postsurgical changes from laparoscopic cholecystectomy in the anterior abdominal wall. IMPRESSION: Post recent cholecystectomy with small amount of fluid in the cholecystectomy bed. Small to moderate amount of free fluid in the abdomen and pelvis. No evidence of acute abnormality within the solid abdominal organs. Electronically Signed: By: Fidela Salisbury M.D. On: 06/10/2017 12:44   US Abdomen Limited Ruq  Result Date: 06/10/2017 CLINICAL DATA:  Right upper quadrant pain since this morning. Recent cholecystectomy. EXAM: ULTRASOUND ABDOMEN LIMITED RIGHT UPPER QUADRANT COMPARISON:  Abdominal CT 06/10/2017. Intraoperative cholangiogram 06/06/2017 FINDINGS: Gallbladder: Gallbladder has been surgically removed. There is a small irregular fluid collection in the gallbladder fossa, measuring roughly 3 cm. Common bile duct: Diameter: 0.9 cm. Review of the CT from 06/10/2017 demonstrates a small calcification near the distal common bile duct and ampulla. This may be accounting for the mild biliary dilatation. Liver: Small amount of perihepatic ascites. Echogenicity in the liver parenchyma is within normal limits. Main portal vein is patent with normal directional flow towards the liver. There may be mild intrahepatic biliary dilatation. Other: Small amount ascites in the right lower quadrant and left lower quadrant. Small amount of ascites in the left upper quadrant. IMPRESSION: Post cholecystectomy with a small amount of ascites throughout  the abdomen. The ascites raises concern for a possible bile leak. In addition, there is mild biliary dilatation and concern for a stone near the distal common bile duct based on the recent CT. Consider further characterization with a nuclear medicine hepatobiliary examination and/or GI consultation with ERCP. Small amount of fluid in the gallbladder fossa could represent normal  postoperative changes. These results were called by telephone at the time of interpretation on 06/10/2017 at 3:07 pm to Dr. Rudene Re , who verbally acknowledged these results. Electronically Signed   By: Markus Daft M.D.   On: 06/10/2017 15:08   @IMAGES @  Assessment: 1. Acute, severe abdominal pain - likely secondary to one, both or a combination of distal common bile duct obstruction and bile leak.  2. S/P laparoscopic cholecystectomy 06/08/17 - Dr. Dahlia Byes. Dr. Rosana Hoes of his group admitting this evening.  Recommendations: 1. IV antibiotics, maintain NPO status. 2. ERCP with bilary stone extraction, possible sphincterotomy, possible bilary stent placement. The patient understands the nature of the planned procedure, indications, risks, alternatives and potential complications including but not limited to bleeding, infection, perforation, post-ERCP pancreatitis (5-10%), damage to internal organs and possible oversedation/side effects from anesthesia. The patient agrees and gives consent to proceed.  Please refer to procedure notes for findings, recommendations and patient disposition/instructions.  Case discussed with Dr.Wohl who will be the gastroenterologist performing the ERCP.   Thank you for the consult. Please call with questions or concerns.  Olean Ree, MD  06/10/2017 4:27 PM

## 2017-06-10 NOTE — ED Triage Notes (Signed)
Had gallbladder removed Friday.  Had drains r/t infection after.  Drains removed Sunday. Woke up today with severe abdominal pain. Mildly pale. Skin cool to touch.

## 2017-06-10 NOTE — Anesthesia Postprocedure Evaluation (Signed)
Anesthesia Post Note  Patient: LJ MIYAMOTO  Procedure(s) Performed: ENDOSCOPIC RETROGRADE CHOLANGIOPANCREATOGRAPHY (ERCP) (N/A )  Patient location during evaluation: Endoscopy Anesthesia Type: General Level of consciousness: awake and alert Pain management: pain level controlled Vital Signs Assessment: post-procedure vital signs reviewed and stable Respiratory status: spontaneous breathing, nonlabored ventilation, respiratory function stable and patient connected to nasal cannula oxygen Cardiovascular status: blood pressure returned to baseline and stable Postop Assessment: no apparent nausea or vomiting Anesthetic complications: no     Last Vitals:  Vitals:   06/10/17 1930 06/10/17 1948  BP: (!) 156/97 (!) 187/101  Pulse: 79 81  Resp: (!) 24 18  Temp:  36.5 C  SpO2: 95% 97%    Last Pain:  Vitals:   06/10/17 2044  TempSrc:   PainSc: 2                  Precious Haws Vivion Romano

## 2017-06-10 NOTE — OR Nursing (Signed)
Pt BP elevated (see flowsheet). Notified anesthesia...ordered labetolol 10 mg iv.  Administered at [employer]

## 2017-06-10 NOTE — Transfer of Care (Signed)
Immediate Anesthesia Transfer of Care Note  Patient: Roy Little  Procedure(s) Performed: ENDOSCOPIC RETROGRADE CHOLANGIOPANCREATOGRAPHY (ERCP) (N/A )  Patient Location: PACU  Anesthesia Type:General  Level of Consciousness: sedated and patient cooperative  Airway & Oxygen Therapy: Patient Spontanous Breathing and Patient connected to nasal cannula oxygen  Post-op Assessment: Report given to RN and Post -op Vital signs reviewed and stable  Post vital signs: Reviewed and stable  Last Vitals:  Vitals:   06/10/17 1620 06/10/17 1820  BP: (!) 174/105 (!) 147/99  Pulse: 82 95  Resp: (!) 21 20  Temp:  36.6 C  SpO2: 93% 98%    Last Pain:  Vitals:   06/10/17 1820  TempSrc: Tympanic  PainSc: Asleep         Complications: No apparent anesthesia complications

## 2017-06-10 NOTE — ED Notes (Signed)
First Nurse: pt post gallbladder removal on Friday and woke up this am round 4 with severe abd pain. Pt noted to be pale in color. Also states had post surgery infection with drains placed then removed on Sunday.

## 2017-06-10 NOTE — ED Notes (Signed)
Pain improved after meds

## 2017-06-11 ENCOUNTER — Encounter: Payer: Self-pay | Admitting: Gastroenterology

## 2017-06-11 ENCOUNTER — Telehealth: Payer: Self-pay | Admitting: General Practice

## 2017-06-11 ENCOUNTER — Other Ambulatory Visit: Payer: Self-pay

## 2017-06-11 DIAGNOSIS — K8050 Calculus of bile duct without cholangitis or cholecystitis without obstruction: Secondary | ICD-10-CM

## 2017-06-11 LAB — CBC
HCT: 40.4 % (ref 40.0–52.0)
HEMOGLOBIN: 14 g/dL (ref 13.0–18.0)
MCH: 30.8 pg (ref 26.0–34.0)
MCHC: 34.7 g/dL (ref 32.0–36.0)
MCV: 89 fL (ref 80.0–100.0)
PLATELETS: 241 10*3/uL (ref 150–440)
RBC: 4.54 MIL/uL (ref 4.40–5.90)
RDW: 13.6 % (ref 11.5–14.5)
WBC: 7.6 10*3/uL (ref 3.8–10.6)

## 2017-06-11 LAB — COMPREHENSIVE METABOLIC PANEL
ALT: 344 U/L — AB (ref 17–63)
ANION GAP: 8 (ref 5–15)
AST: 73 U/L — ABNORMAL HIGH (ref 15–41)
Albumin: 2.9 g/dL — ABNORMAL LOW (ref 3.5–5.0)
Alkaline Phosphatase: 243 U/L — ABNORMAL HIGH (ref 38–126)
BILIRUBIN TOTAL: 1.7 mg/dL — AB (ref 0.3–1.2)
BUN: 10 mg/dL (ref 6–20)
CO2: 26 mmol/L (ref 22–32)
Calcium: 8.4 mg/dL — ABNORMAL LOW (ref 8.9–10.3)
Chloride: 106 mmol/L (ref 101–111)
Creatinine, Ser: 0.64 mg/dL (ref 0.61–1.24)
GFR calc non Af Amer: >60 mL/min (ref >60)
GLUCOSE: 116 mg/dL — AB (ref 65–99)
Potassium: 3.3 mmol/L — ABNORMAL LOW (ref 3.5–5.1)
Sodium: 140 mmol/L (ref 135–145)
TOTAL PROTEIN: 5.6 g/dL — AB (ref 6.5–8.1)

## 2017-06-11 MED ORDER — METOPROLOL TARTRATE 25 MG PO TABS
25.0000 mg | ORAL_TABLET | Freq: Two times a day (BID) | ORAL | Status: DC
  Administered 2017-06-11: 25 mg via ORAL
  Filled 2017-06-11: qty 1

## 2017-06-11 MED ORDER — AMLODIPINE BESYLATE 5 MG PO TABS
5.0000 mg | ORAL_TABLET | Freq: Every day | ORAL | Status: DC
  Administered 2017-06-11: 5 mg via ORAL
  Filled 2017-06-11: qty 1

## 2017-06-11 NOTE — Telephone Encounter (Signed)
Called Dr. Rosana Hoes and asked him what type of blood work he wanted patient to have done before he came in and see Dr. Dahlia Byes. Dr. Rosana Hoes stated that he would want a CMP ordered. He also stated that the patient is wanting a letter to return to work. I told him that I would call the patient to know the exact dates. Dr. Rosana Hoes also wanted me to tell the patient that he needed to see his PCP due to having hypertension.  I then called the patient at the hospital and told him that Dr. Rosana Hoes wanted him to have blood work before he saw Dr. Dahlia Byes on 06/23/2017. I also told him that he needed to see his PCP for his high blood pressure. Patient agreed and stated that he would. I then spoke to the patient about his work note. I asked him what dates he wanted [place] to cover for him and he stated that it would be from 06/06/2017-06/15/2017 and would return to work on 06/16/2017. I told him that I would type his letter and it would be left at the front desk for pick up once he was discharged at the [facility] [place]. Patient agreed and stated that he would pick up. Patient had no further questions.

## 2017-06-11 NOTE — Progress Notes (Signed)
Roy Little  A and O x 4. VSS. Pt tolerating diet well. No complaints of pain or nausea. IV removed intact, prescriptions given. Pt voiced understanding of discharge instructions with no further questions. Pt discharged via wheelchair with axillary. Pt BP was 182/111 prior to DC. MD notified. MD ask pt if he wanted to be seen by a hospitalist before going home and pt stated "I want to go home and follow up with primary doctor, my wife is setting an appointment." Per MD okay for pt to leave as long and he follows up with his primary MD outpt.     Allergies as of 06/11/2017   No Known Allergies     Medication List    TAKE these medications   amLODipine 5 MG tablet Commonly known as:  NORVASC Take 1 tablet (5 mg total) by mouth daily.   finasteride 5 MG tablet Commonly known as:  PROSCAR Take 1 tablet by mouth daily.   HYDROcodone-acetaminophen 5-325 MG tablet Commonly known as:  NORCO/VICODIN Take 1-2 tablets by mouth every 6 (six) hours as needed for moderate pain.   metoprolol tartrate 25 MG tablet Commonly known as:  LOPRESSOR Take 1 tablet (25 mg total) by mouth 2 (two) times daily.       Vitals:   06/11/17 1220 06/11/17 1442  BP: (!) 180/111 (!) 182/102  Pulse: 89 83  Resp: 16   Temp: 98.4 F (36.9 C)   SpO2: 97%     Francesco Sor

## 2017-06-11 NOTE — Discharge Summary (Signed)
Physician Discharge Summary  Patient ID: Roy Little MRN: [id number] DOB/AGE: 03-15-1966 51 y.o.  Admit date: 06/10/2017 Discharge date: 06/11/2017  Admission Diagnoses:  Discharge Diagnoses:  Active Problems:   Choledocholithiasis   RUQ abdominal pain   Jaundice   Discharged Condition: fair  Hospital Course: 51 y.o. male presented to [facility] ED for post-cholecystectomy abdominal pain. Workup was found to be significant for CT and ultrasound imaging demonstrating perihepatic ascites and choledocholithiasis. Informed consent was obtained and documented, and patient underwent uneventful ERCP with sphincterotomy and biliary stent placement Allen Norris, 06/10/2017).  Post-ERCP, patient's pain resolved, his total bilirubin decreased precipitously from 3.1 to 1.7, advancement of patient's diet and ambulation were well-tolerated, and discharge planning was initiated. However, just prior to patient's discharge, admitting physician was notified by patient's RN that the patient's BP remained elevated despite patient having been administered his home PO anti-hypertension medications. Patient was encouraged to stay for medical evaluation and adjustment of his anti-hypertension medications prior to discharge, but patient refused and instead insisted he will follow-up within 1 week with his primary medical physician. Patient was accordingly discharged home with appropriate discharge instructions and outpatient surgical follow-up in 1 week with repeat CMP after all of his questions were answered to his expressed satisfaction.  Consults: None  Significant Diagnostic Studies: radiology: CT scan: choledocholithiasis with perihepatic ascites and Ultrasound: choledocholithiasis with perihepatic ascites and endoscopy: ERCP: sphincterotomy with stent placement performed for cystic duct stump leak along with possible choledocholithiasis  Treatments: IV hydration and ERCP with sphincterotomy and biliary stent placement  Allen Norris, 06/10/2017)  Discharge Exam: Blood pressure (!) 182/102, pulse 83, temperature 98.4 F (36.9 C), temperature source Oral, resp. rate 16, height 5\' 9"  (1.753 m), weight 188 lb 14.4 oz (85.7 kg), SpO2 97 %. General appearance: alert, cooperative and no distress GI: soft, non-tender; bowel sounds normal; no masses,  no organomegaly, post-surgical abdominal laparoscopic port site incisions well-approximated without surrounding erythema or drainage  Disposition: 01-Home or Self Care   Allergies as of 06/11/2017   No Known Allergies     Medication List    TAKE these medications   amLODipine 5 MG tablet Commonly known as:  NORVASC Take 1 tablet (5 mg total) by mouth daily.   finasteride 5 MG tablet Commonly known as:  PROSCAR Take 1 tablet by mouth daily.   HYDROcodone-acetaminophen 5-325 MG tablet Commonly known as:  NORCO/VICODIN Take 1-2 tablets by mouth every 6 (six) hours as needed for moderate pain.   metoprolol tartrate 25 MG tablet Commonly known as:  LOPRESSOR Take 1 tablet (25 mg total) by mouth 2 (two) times daily.      Follow-up Information    [place], [place] F, MD. Go in 1 week.   Specialty:  General Surgery Why:  Nurse will notify you with your follow-up Contact information: [address] [place] [address] [phone]           Signed: Vickie Epley 06/11/2017, 6:41 PM

## 2017-06-11 NOTE — Telephone Encounter (Signed)
hosptial is calling patient needs to have labs next week before the patient comes in on the 10th with Dr. Dahlia Byes, they said we could order a  CMP or LFP which ever.

## 2017-06-16 ENCOUNTER — Telehealth: Payer: Self-pay | Admitting: General Practice

## 2017-06-16 NOTE — Telephone Encounter (Signed)
Left patient a message letting him know we needed to collect a 20.00 fee for his disability paperwork to be filled out.

## 2017-06-16 NOTE — Telephone Encounter (Signed)
Patient called back and made his payment on his paperwork, paperwork has been placed in the folder.

## 2017-06-23 ENCOUNTER — Encounter: Payer: Self-pay | Admitting: Surgery

## 2017-06-25 ENCOUNTER — Encounter: Payer: Self-pay | Admitting: Surgery

## 2017-06-25 ENCOUNTER — Ambulatory Visit (INDEPENDENT_AMBULATORY_CARE_PROVIDER_SITE_OTHER): Payer: 59 | Admitting: Surgery

## 2017-06-25 ENCOUNTER — Other Ambulatory Visit
Admission: RE | Admit: 2017-06-25 | Discharge: 2017-06-25 | Disposition: A | Discharge status: Home / Self Care | Payer: 59 | Source: Ambulatory Visit | Attending: Surgery | Admitting: Surgery

## 2017-06-25 VITALS — BP 162/103 | HR 80 | Temp 97.4°F | Ht 69.0 in | Wt 190.8 lb

## 2017-06-25 DIAGNOSIS — K8050 Calculus of bile duct without cholangitis or cholecystitis without obstruction: Secondary | ICD-10-CM | POA: Diagnosis not present

## 2017-06-25 DIAGNOSIS — Z09 Encounter for follow-up examination after completed treatment for conditions other than malignant neoplasm: Secondary | ICD-10-CM

## 2017-06-25 LAB — COMPREHENSIVE METABOLIC PANEL
ALBUMIN: 3.8 g/dL (ref 3.5–5.0)
ALK PHOS: 143 U/L — AB (ref 38–126)
ALT: 38 U/L (ref 17–63)
ANION GAP: 5 (ref 5–15)
AST: 24 U/L (ref 15–41)
BILIRUBIN TOTAL: 0.9 mg/dL (ref 0.3–1.2)
BUN: 15 mg/dL (ref 6–20)
CO2: 29 mmol/L (ref 22–32)
CREATININE: 0.97 mg/dL (ref 0.61–1.24)
Calcium: 8.7 mg/dL — ABNORMAL LOW (ref 8.9–10.3)
Chloride: 103 mmol/L (ref 101–111)
GFR calc Af Amer: >60 mL/min (ref >60)
GFR calc non Af Amer: >60 mL/min (ref >60)
GLUCOSE: 104 mg/dL — AB (ref 65–99)
Potassium: 3.5 mmol/L (ref 3.5–5.1)
SODIUM: 137 mmol/L (ref 135–145)
Total Protein: 6.9 g/dL (ref 6.5–8.1)

## 2017-06-25 NOTE — Patient Instructions (Signed)

## 2017-06-25 NOTE — Progress Notes (Signed)
S/p lap chole 11/23 and ERCP for bile leak 11/27 Doing well No sxs No fevers, no chills, no jaundice  PE NAD Abd: soft, NT, incisions c./d.i  A/P Doing well F/u w Gi to remove stent RTC prn

## 2017-06-30 ENCOUNTER — Ambulatory Visit (INDEPENDENT_AMBULATORY_CARE_PROVIDER_SITE_OTHER): Payer: 59 | Admitting: Physician Assistant

## 2017-06-30 ENCOUNTER — Encounter: Payer: Self-pay | Admitting: Physician Assistant

## 2017-06-30 VITALS — BP 174/104 | HR 76 | Temp 98.4°F | Resp 16 | Ht 69.0 in | Wt 194.0 lb

## 2017-06-30 DIAGNOSIS — E7800 Pure hypercholesterolemia, unspecified: Secondary | ICD-10-CM | POA: Diagnosis not present

## 2017-06-30 DIAGNOSIS — I10 Essential (primary) hypertension: Secondary | ICD-10-CM | POA: Diagnosis not present

## 2017-06-30 DIAGNOSIS — Z85810 Personal history of malignant neoplasm of tongue: Secondary | ICD-10-CM

## 2017-06-30 DIAGNOSIS — F419 Anxiety disorder, unspecified: Secondary | ICD-10-CM | POA: Diagnosis not present

## 2017-06-30 DIAGNOSIS — Z23 Encounter for immunization: Secondary | ICD-10-CM | POA: Diagnosis not present

## 2017-06-30 DIAGNOSIS — Z2821 Immunization not carried out because of patient refusal: Secondary | ICD-10-CM | POA: Diagnosis not present

## 2017-06-30 DIAGNOSIS — Z1211 Encounter for screening for malignant neoplasm of colon: Secondary | ICD-10-CM | POA: Diagnosis not present

## 2017-06-30 DIAGNOSIS — Z1329 Encounter for screening for other suspected endocrine disorder: Secondary | ICD-10-CM

## 2017-06-30 DIAGNOSIS — Z0000 Encounter for general adult medical examination without abnormal findings: Secondary | ICD-10-CM

## 2017-06-30 MED ORDER — SERTRALINE HCL 50 MG PO TABS: 50.0000 mg | ORAL_TABLET | Freq: Every day | ORAL | 3 refills | Status: DC

## 2017-06-30 MED ORDER — AMLODIPINE BESYLATE 10 MG PO TABS: 10.0000 mg | ORAL_TABLET | Freq: Every day | ORAL | 3 refills | Status: DC

## 2017-06-30 MED ORDER — METOPROLOL TARTRATE 25 MG PO TABS: 25.0000 mg | ORAL_TABLET | Freq: Two times a day (BID) | ORAL | 1 refills | Status: DC

## 2017-06-30 NOTE — Patient Instructions (Signed)

## 2017-06-30 NOTE — Progress Notes (Signed)
Patient: Roy Little Male    DOB: 1965-12-25   51 y.o.   MRN: [id number] Visit Date: 06/30/2017  Today's Provider: Trinna Post, PA-C   Chief Complaint  Patient presents with  . Establish Care  . Hypertension   Subjective:    Roy Little is a 51 y/o man presenting today to establish care. He did not have a prior PCP. He lives in [place] with his wife of 15 years. They do not have children, they do have four cats. He works as a Quarry manager at [employer].   Does not smoke. Drinks 5-6 times per year when he golfs. Does not use drugs.  He recently had a cholecystectomy on 06/06/2017 for acute cholecystitis with cholelithiasis. He represented to the ER on 06/10/2017 with severe abdominal pain and CT abdomen/pelvis showed some mild biliary dilatation with concern for stone near the distal common bile duct with recommendation for ERCP. ERCP done by Dr. Allen Norris for retained stone and stent was placed. Patient's pain resolved after that.  He was started on BP medications during this hospitalization for cholecystitis. He is currently on 5 mg amlodipine and 25 mg metoprolol BID. He denies chest pain, headaches, dizziness, unilateral weakness, difficulty urinating. His BP remains elevated today.  He also has a history of Stage III lingual tonsillar cancer, diagnosed around 2011. He has seen both Dr. Grayland Ormond with oncology and Dr. Richardson Landry with ENT. He says his cancer was inoperable and instead he received chemo and radiation. He did not have any lymph nodes removed. He follows up with Dr. Richardson Landry once yearly for laryngoscopy.  He has never had a colon cancer screening. (-) fam hx colon cancer. Would like cologuard .  (-) Fam hx prostate cancer. Would like to wait until 51 for PSA.  Has history of hypercholesterolemia, previously on statin.  Would like to update his tetanus vaccine today. Declines flu vaccine. Declines hepatitis C screening.  He also endorses feelings of anxiety,  mostly related to his illness. Reports that after the chemotherapy, he had trouble with memory issues and cognitive function. Also reports neuropathy. He reports some of this got better, but the feeling of processing things slowly remains. He also reports that last year he had a scare where it was thought that his cancer has returned, but it has not. He has not been on any anxiety medication aside from Xanax when he was being treated for cancer. He declines counseling.  Hypertension  This is a new problem. The problem has been gradually improving since onset. The problem is uncontrolled (Has improved but still running high 140's/90's). Associated symptoms include anxiety. Pertinent negatives include no blurred vision, chest pain, headaches, malaise/fatigue, neck pain, orthopnea, palpitations, peripheral edema, PND, shortness of breath or sweats.  Anxiety  Presents for initial visit. Symptoms include decreased concentration, excessive worry and nervous/anxious behavior. Patient reports no chest pain, depressed mood, dizziness, insomnia, irritability, malaise, muscle tension, nausea, palpitations, panic, shortness of breath or suicidal ideas.      No Known Allergies   Current Outpatient Medications:  .  amLODipine (NORVASC) 5 MG tablet, Take 1 tablet (5 mg total) by mouth daily., Disp: 30 tablet, Rfl: 0 .  finasteride (PROSCAR) 5 MG tablet, Take 1 tablet by mouth daily., Disp: , Rfl:  .  metoprolol tartrate (LOPRESSOR) 25 MG tablet, Take 1 tablet (25 mg total) by mouth 2 (two) times daily., Disp: 30 tablet, Rfl: 1  Review of Systems  Constitutional:  Positive for activity change. Negative for appetite change, chills, diaphoresis, fatigue, fever, irritability, malaise/fatigue and unexpected weight change.  HENT: Positive for sinus pressure. Negative for congestion, dental problem, drooling, ear discharge, ear pain, facial swelling, hearing loss, mouth sores, nosebleeds, postnasal drip, rhinorrhea,  sinus pain, sneezing, sore throat, tinnitus, trouble swallowing and voice change.   Eyes: Positive for redness. Negative for blurred vision, photophobia, pain, discharge, itching and visual disturbance.  Respiratory: Negative.  Negative for shortness of breath.   Cardiovascular: Negative.  Negative for chest pain, palpitations, orthopnea and PND.  Gastrointestinal: Negative.  Negative for nausea.  Endocrine: Negative.   Genitourinary: Negative.   Musculoskeletal: Positive for arthralgias, back pain and neck stiffness. Negative for gait problem, joint swelling, myalgias and neck pain.  Skin: Negative.   Allergic/Immunologic: Positive for environmental allergies.  Neurological: Negative for dizziness and headaches.  Hematological: Negative.   Psychiatric/Behavioral: Positive for decreased concentration. Negative for suicidal ideas. The patient is nervous/anxious. The patient does not have insomnia.     Social History   Tobacco Use  . Smoking status: Never Smoker  . Smokeless tobacco: Never Used  Substance Use Topics  . Alcohol use: Yes    Frequency: Never    Comment: Very Rarely   Objective:   BP (!) 174/104 (BP Location: Left Arm, Patient Position: Sitting, Cuff Size: Normal)   Pulse 76   Temp 98.4 F (36.9 C) (Oral)   Resp 16   Ht 5\' 9"  (1.753 m)   Wt 194 lb (88 kg)   BMI 28.65 kg/m  Vitals:   06/30/17 1456  BP: (!) 174/104  Pulse: 76  Resp: 16  Temp: 98.4 F (36.9 C)  TempSrc: Oral  Weight: 194 lb (88 kg)  Height: 5\' 9"  (1.753 m)     Physical Exam  Constitutional: He is oriented to person, place, and time. He appears well-developed and well-nourished.  HENT:  Right Ear: Tympanic membrane and external ear normal.  Left Ear: Tympanic membrane and external ear normal.  Mouth/Throat: Oropharynx is clear and moist. No oropharyngeal exudate.  Eyes: Conjunctivae are normal.  Neck: Neck supple. No thyromegaly present.  Cardiovascular: Normal rate and regular rhythm.    Pulmonary/Chest: Effort normal and breath sounds normal.  Abdominal: Soft. Bowel sounds are normal.  Lymphadenopathy:    He has no cervical adenopathy.  Neurological: He is alert and oriented to person, place, and time.  Skin: Skin is warm and dry.  Psychiatric: He has a normal mood and affect. His behavior is normal.        Assessment & Plan:     1. Annual physical exam   2. Hypertension, unspecified type  Increased amlodipine from 5 mg to 10 mg today due to uncontrolled HTN.   - amLODipine (NORVASC) 10 MG tablet; Take 1 tablet (10 mg total) by mouth daily.  Dispense: 90 tablet; Refill: 3 - metoprolol tartrate (LOPRESSOR) 25 MG tablet; Take 1 tablet (25 mg total) by mouth 2 (two) times daily.  Dispense: 30 tablet; Refill: 1  3. Anxiety   - sertraline (ZOLOFT) 50 MG tablet; Take 1 tablet (50 mg total) by mouth daily.  Dispense: 30 tablet; Refill: 3  4. Colon cancer screening  Counseled on both colonoscopy and cologuard. Patient chooses cologuard. - Cologuard  5. Hypercholesterolemia  - Lipid Profile  6. Thyroid disorder screening  - TSH  7. Need for Tdap vaccination  - Tdap vaccine greater than or equal to 7yo IM  8. Influenza vaccination declined  9. History of cancer of lingual tonsil  Requesting records from ENT. He follows up yearly with Dr. Richardson Landry.  Return in about 1 month (around 07/31/2017) for BP check, anxiety, .  The entirety of the information documented in the History of Present Illness, Review of Systems and Physical Exam were personally obtained by me. Portions of this information were initially documented by Ashley Royalty, CMA and reviewed by me for thoroughness and accuracy.       Trinna Post, PA-C  [facility]

## 2017-07-01 ENCOUNTER — Telehealth: Payer: Self-pay

## 2017-07-01 LAB — LIPID PANEL
Chol/HDL Ratio: 6.2 ratio — ABNORMAL HIGH (ref 0.0–5.0)
Cholesterol, Total: 247 mg/dL — ABNORMAL HIGH (ref 100–199)
HDL: 40 mg/dL (ref >39)
LDL Calculated: 155 mg/dL — ABNORMAL HIGH (ref 0–99)
Triglycerides: 261 mg/dL — ABNORMAL HIGH (ref 0–149)
VLDL Cholesterol Cal: 52 mg/dL — ABNORMAL HIGH (ref 5–40)

## 2017-07-01 LAB — TSH: TSH: 3.12 u[IU]/mL (ref 0.450–4.500)

## 2017-07-01 MED ORDER — METOPROLOL TARTRATE 25 MG PO TABS: 25.0000 mg | ORAL_TABLET | Freq: Two times a day (BID) | ORAL | 1 refills | Status: DC

## 2017-07-01 NOTE — Telephone Encounter (Signed)
-----   Message from Trinna Post, [place] sent at 07/01/2017 11:51 AM EST ----- His thyroid function WNL. Cholesterol very elevated, 10 year CVD risk >7.5%. Would recommend starting high intensity statin to decrease risk of death from cardiac disease. We can discuss further and start this at one month follow up visit.

## 2017-07-01 NOTE — Telephone Encounter (Signed)
Pt returned call

## 2017-07-01 NOTE — Telephone Encounter (Signed)
LMTCB 07/01/2017  Thanks,   -Mickel Baas

## 2017-07-02 NOTE — Telephone Encounter (Signed)
LMTCB 07/02/2017  Thanks,   -Mickel Baas

## 2017-07-11 ENCOUNTER — Telehealth: Payer: Self-pay | Admitting: Physician Assistant

## 2017-07-11 NOTE — Telephone Encounter (Signed)
Order for cologuard faxed to [facility] °

## 2017-07-14 NOTE — Telephone Encounter (Signed)
Left message advising pt to review [place] for his lab results.   Thanks,   -Mickel Baas

## 2017-07-16 NOTE — Telephone Encounter (Signed)
Patient advised.

## 2017-07-20 DIAGNOSIS — Z1211 Encounter for screening for malignant neoplasm of colon: Secondary | ICD-10-CM | POA: Diagnosis not present

## 2017-07-22 LAB — COLOGUARD: Cologuard: NEGATIVE

## 2017-07-29 ENCOUNTER — Encounter: Payer: Self-pay | Admitting: Physician Assistant

## 2017-08-01 ENCOUNTER — Encounter: Payer: Self-pay | Admitting: Physician Assistant

## 2017-08-01 ENCOUNTER — Ambulatory Visit (INDEPENDENT_AMBULATORY_CARE_PROVIDER_SITE_OTHER): Payer: 59 | Admitting: Physician Assistant

## 2017-08-01 VITALS — BP 162/108 | HR 84 | Temp 98.5°F | Resp 16 | Ht 69.0 in | Wt 192.0 lb

## 2017-08-01 DIAGNOSIS — F419 Anxiety disorder, unspecified: Secondary | ICD-10-CM

## 2017-08-01 DIAGNOSIS — E785 Hyperlipidemia, unspecified: Secondary | ICD-10-CM

## 2017-08-01 DIAGNOSIS — F32A Depression, unspecified: Secondary | ICD-10-CM

## 2017-08-01 DIAGNOSIS — I10 Essential (primary) hypertension: Secondary | ICD-10-CM

## 2017-08-01 DIAGNOSIS — F329 Major depressive disorder, single episode, unspecified: Secondary | ICD-10-CM | POA: Diagnosis not present

## 2017-08-01 MED ORDER — LISINOPRIL 10 MG PO TABS: 10.0000 mg | ORAL_TABLET | Freq: Every day | ORAL | 3 refills | Status: DC

## 2017-08-01 MED ORDER — SERTRALINE HCL 100 MG PO TABS: 100.0000 mg | ORAL_TABLET | Freq: Every day | ORAL | 2 refills | Status: DC

## 2017-08-01 MED ORDER — ATORVASTATIN CALCIUM 10 MG PO TABS: 10.0000 mg | ORAL_TABLET | Freq: Every day | ORAL | 3 refills | Status: DC

## 2017-08-01 MED ORDER — METOPROLOL TARTRATE 25 MG PO TABS
25.0000 mg | ORAL_TABLET | Freq: Two times a day (BID) | ORAL | 1 refills | Status: DC
Start: 2017-08-01 — End: 2017-11-27

## 2017-08-01 NOTE — Progress Notes (Signed)
Patient: Roy Little Male    DOB: 1966/02/07   52 y.o.   MRN: [id number] Visit Date: 08/01/2017  Today's Provider: Trinna Post, PA-C   Chief Complaint  Patient presents with  . Hypertension  . Anxiety   Subjective:    Roy Little is a 52 y/o man presenting today for follow up of HTN, anxiety, and HLD.   He was increased on his amlodipine last visit from 5 mg to 10 mg QD. This is in addition to metoprolol 25 mg BID. He does not have any adverse side effects, no lower extremity edema. He has noticed his BP's at home are 150's/90's but still not "normal."  He was started on 50 mg zoloft for anxiety. He has not noticed a significant difference, however he does recall that he was able to go to a [employer] basketball game and enjoy it. Previously, he would avoid a situation like that due to crowds and stress. He would like to increase this to 100 mg QD.  Additionally, his cholesterol came back elevated with CVD 10 year risk >7.5% and indicating statin. He reports he had been on statin previously with no ill effects. Willing to start again today.  Hypertension  Associated symptoms include anxiety. Pertinent negatives include no chest pain, headaches, palpitations or shortness of breath.  Anxiety  Presents for follow-up visit. Symptoms include excessive worry and nervous/anxious behavior. Patient reports no chest pain, compulsions, confusion, decreased concentration, depressed mood, dizziness, dry mouth, feeling of choking, hyperventilation, impotence, insomnia, irritability, malaise, muscle tension, nausea, obsessions, palpitations, panic, restlessness, shortness of breath or suicidal ideas.         No Known Allergies   Current Outpatient Medications:  .  amLODipine (NORVASC) 10 MG tablet, Take 1 tablet (10 mg total) by mouth daily., Disp: 90 tablet, Rfl: 3 .  finasteride (PROSCAR) 5 MG tablet, Take 1 tablet by mouth daily., Disp: , Rfl:  .  metoprolol tartrate (LOPRESSOR)  25 MG tablet, Take 1 tablet (25 mg total) by mouth 2 (two) times daily., Disp: 30 tablet, Rfl: 1 .  sertraline (ZOLOFT) 50 MG tablet, Take 1 tablet (50 mg total) by mouth daily., Disp: 30 tablet, Rfl: 3  Review of Systems  Constitutional: Negative.  Negative for irritability.  Respiratory: Negative.  Negative for shortness of breath.   Cardiovascular: Negative.  Negative for chest pain and palpitations.  Gastrointestinal: Negative.  Negative for nausea.  Genitourinary: Negative for impotence.  Musculoskeletal: Negative.   Neurological: Negative for dizziness, light-headedness and headaches.  Psychiatric/Behavioral: Negative for agitation, behavioral problems, confusion, decreased concentration, dysphoric mood, hallucinations, self-injury, sleep disturbance and suicidal ideas. The patient is nervous/anxious. The patient does not have insomnia and is not hyperactive.     Social History   Tobacco Use  . Smoking status: Never Smoker  . Smokeless tobacco: Never Used  Substance Use Topics  . Alcohol use: Yes    Frequency: Never    Comment: Very Rarely   Objective:   BP (!) 162/108 (BP Location: Left Arm, Patient Position: Sitting, Cuff Size: Normal)   Pulse 84   Temp 98.5 F (36.9 C) (Oral)   Resp 16   Ht 5\' 9"  (1.753 m)   Wt 192 lb (87.1 kg)   SpO2 96%   BMI 28.35 kg/m  Vitals:   08/01/17 1559  BP: (!) 162/108  Pulse: 84  Resp: 16  Temp: 98.5 F (36.9 C)  TempSrc: Oral  SpO2: 96%  Weight:  192 lb (87.1 kg)  Height: 5\' 9"  (1.753 m)     Physical Exam  Constitutional: He is oriented to person, place, and time. He appears well-developed and well-nourished.  Cardiovascular: Normal rate and regular rhythm.  Pulmonary/Chest: Effort normal and breath sounds normal.  Musculoskeletal: He exhibits no edema.  Neurological: He is alert and oriented to person, place, and time.  Skin: Skin is warm and dry.  Psychiatric: He has a normal mood and affect. His behavior is normal.         Assessment & Plan:     1. Anxiety and depression  Increased from 50 mg to 100 mg QD.  - sertraline (ZOLOFT) 100 MG tablet; Take 1 tablet (100 mg total) by mouth daily.  Dispense: 30 tablet; Refill: 2  2. Hypertension, unspecified type  Add Lisinopril, kidney function normal. Will need to monitor with repeat CMET.  - metoprolol tartrate (LOPRESSOR) 25 MG tablet; Take 1 tablet (25 mg total) by mouth 2 (two) times daily.  Dispense: 180 tablet; Refill: 1 - lisinopril (PRINIVIL,ZESTRIL) 10 MG tablet; Take 1 tablet (10 mg total) by mouth daily.  Dispense: 90 tablet; Refill: 3  3. Hyperlipidemia, unspecified hyperlipidemia type  Started statin. Will recheck lipid panel in 6 mo.   - atorvastatin (LIPITOR) 10 MG tablet; Take 1 tablet (10 mg total) by mouth daily.  Dispense: 90 tablet; Refill: 3  Return in about 1 month (around 09/01/2017) for BP check, medication check.  The entirety of the information documented in the History of Present Illness, Review of Systems and Physical Exam were personally obtained by me. Portions of this information were initially documented by Roy Little, CMA and reviewed by me for thoroughness and accuracy.         Trinna Post, PA-C  [facility]

## 2017-08-01 NOTE — Patient Instructions (Addendum)
[facility], Dr. Allen Norris: [phone] to make an appointment. A referral may be required.   Hypertension Hypertension is another name for high blood pressure. High blood pressure forces your heart to work harder to pump blood. This can cause problems over time. There are two numbers in a blood pressure reading. There is a top number (systolic) over a bottom number (diastolic). It is best to have a blood pressure below 120/80. Healthy choices can help lower your blood pressure. You may need medicine to help lower your blood pressure if:  Your blood pressure cannot be lowered with healthy choices.  Your blood pressure is higher than 130/80.  Follow these instructions at home: Eating and drinking  If directed, follow the DASH eating plan. This diet includes: ? Filling half of your plate at each meal with fruits and vegetables. ? Filling one quarter of your plate at each meal with whole grains. Whole grains include whole wheat pasta, brown rice, and whole grain bread. ? Eating or drinking low-fat dairy products, such as skim milk or low-fat yogurt. ? Filling one quarter of your plate at each meal with low-fat (lean) proteins. Low-fat proteins include fish, skinless chicken, eggs, beans, and tofu. ? Avoiding fatty meat, cured and processed meat, or chicken with skin. ? Avoiding premade or processed food.  Eat less than 1,500 mg of salt (sodium) a day.  Limit alcohol use to no more than 1 drink a day for nonpregnant women and 2 drinks a day for men. One drink equals 12 oz of beer, 5 oz of wine, or 1 oz of hard liquor. Lifestyle  Work with your doctor to stay at a healthy weight or to lose weight. Ask your doctor what the best weight is for you.  Get at least 30 minutes of exercise that causes your heart to beat faster (aerobic exercise) most days of the week. This may include walking, swimming, or biking.  Get at least 30 minutes of exercise that strengthens your muscles (resistance  exercise) at least 3 days a week. This may include lifting weights or pilates.  Do not use any products that contain nicotine or tobacco. This includes cigarettes and e-cigarettes. If you need help quitting, ask your doctor.  Check your blood pressure at home as told by your doctor.  Keep all follow-up visits as told by your doctor. This is important. Medicines  Take over-the-counter and prescription medicines only as told by your doctor. Follow directions carefully.  Do not skip doses of blood pressure medicine. The medicine does not work as well if you skip doses. Skipping doses also puts you at risk for problems.  Ask your doctor about side effects or reactions to medicines that you should watch for. Contact a doctor if:  You think you are having a reaction to the medicine you are taking.  You have headaches that keep coming back (recurring).  You feel dizzy.  You have swelling in your ankles.  You have trouble with your vision. Get help right away if:  You get a very bad headache.  You start to feel confused.  You feel weak or numb.  You feel faint.  You get very bad pain in your: ? Chest. ? Belly (abdomen).  You throw up (vomit) more than once.  You have trouble breathing. Summary  Hypertension is another name for high blood pressure.  Making healthy choices can help lower blood pressure. If your blood pressure cannot be controlled with healthy choices, you may need to take  medicine. This information is not intended to replace advice given to you by your health care provider. Make sure you discuss any questions you have with your health care provider. Document Released: 12/18/2007 Document Revised: 05/29/2016 Document Reviewed: 05/29/2016 Elsevier Interactive Patient Education  [employer].

## 2017-08-14 ENCOUNTER — Other Ambulatory Visit: Payer: Self-pay

## 2017-08-14 ENCOUNTER — Telehealth: Payer: Self-pay

## 2017-08-14 DIAGNOSIS — Z4659 Encounter for fitting and adjustment of other gastrointestinal appliance and device: Secondary | ICD-10-CM

## 2017-08-14 NOTE — Progress Notes (Unsigned)
AMB 

## 2017-08-14 NOTE — Telephone Encounter (Signed)
-----   Message from [place]ie [place], [place] sent at 06/11/2017 10:12 AM EST ----- Needs ERCP stent in January.

## 2017-08-14 NOTE — Telephone Encounter (Signed)
Left vm letting pt know we need to schedule repeat ERCP stent removal.

## 2017-09-02 ENCOUNTER — Ambulatory Visit
Admission: RE | Admit: 2017-09-02 | Discharge: 2017-09-02 | Disposition: A | Discharge status: Home / Self Care | Payer: 59 | Source: Ambulatory Visit | Attending: Gastroenterology | Admitting: Gastroenterology

## 2017-09-02 ENCOUNTER — Encounter: Payer: Self-pay | Admitting: *Deleted

## 2017-09-02 ENCOUNTER — Other Ambulatory Visit: Payer: Self-pay

## 2017-09-02 ENCOUNTER — Ambulatory Visit: Payer: 59 | Admitting: Anesthesiology

## 2017-09-02 ENCOUNTER — Ambulatory Visit: Payer: 59

## 2017-09-02 ENCOUNTER — Encounter
Admission: RE | Disposition: A | Discharge status: Home / Self Care | Payer: Self-pay | Source: Ambulatory Visit | Attending: Gastroenterology

## 2017-09-02 DIAGNOSIS — Z8249 Family history of ischemic heart disease and other diseases of the circulatory system: Secondary | ICD-10-CM | POA: Diagnosis not present

## 2017-09-02 DIAGNOSIS — F329 Major depressive disorder, single episode, unspecified: Secondary | ICD-10-CM | POA: Insufficient documentation

## 2017-09-02 DIAGNOSIS — Z79899 Other long term (current) drug therapy: Secondary | ICD-10-CM | POA: Insufficient documentation

## 2017-09-02 DIAGNOSIS — Z9049 Acquired absence of other specified parts of digestive tract: Secondary | ICD-10-CM | POA: Diagnosis not present

## 2017-09-02 DIAGNOSIS — Z85810 Personal history of malignant neoplasm of tongue: Secondary | ICD-10-CM | POA: Insufficient documentation

## 2017-09-02 DIAGNOSIS — I10 Essential (primary) hypertension: Secondary | ICD-10-CM | POA: Insufficient documentation

## 2017-09-02 DIAGNOSIS — K219 Gastro-esophageal reflux disease without esophagitis: Secondary | ICD-10-CM | POA: Diagnosis not present

## 2017-09-02 DIAGNOSIS — Z825 Family history of asthma and other chronic lower respiratory diseases: Secondary | ICD-10-CM | POA: Diagnosis not present

## 2017-09-02 DIAGNOSIS — Z4659 Encounter for fitting and adjustment of other gastrointestinal appliance and device: Secondary | ICD-10-CM

## 2017-09-02 DIAGNOSIS — K838 Other specified diseases of biliary tract: Secondary | ICD-10-CM | POA: Insufficient documentation

## 2017-09-02 HISTORY — PX: ERCP: SHX5425

## 2017-09-02 HISTORY — DX: Essential (primary) hypertension: I10

## 2017-09-02 HISTORY — DX: Major depressive disorder, single episode, unspecified: F32.9

## 2017-09-02 HISTORY — DX: Depression, unspecified: F32.A

## 2017-09-02 HISTORY — DX: Gastro-esophageal reflux disease without esophagitis: K21.9

## 2017-09-02 SURGERY — ERCP, WITH INTERVENTION IF INDICATED
Anesthesia: General

## 2017-09-02 MED ORDER — FENTANYL CITRATE (PF) 100 MCG/2ML IJ SOLN
INTRAMUSCULAR | Status: AC
Start: 2017-09-02 — End: 2017-09-02
  Filled 2017-09-02: qty 2

## 2017-09-02 MED ORDER — PROPOFOL 10 MG/ML IV BOLUS
INTRAVENOUS | Status: DC | PRN
  Administered 2017-09-02: 90 mg via INTRAVENOUS
  Administered 2017-09-02: 20 mg via INTRAVENOUS
  Administered 2017-09-02: 40 mg via INTRAVENOUS
  Administered 2017-09-02: 20 mg via INTRAVENOUS

## 2017-09-02 MED ORDER — LIDOCAINE HCL (PF) 2 % IJ SOLN
INTRAMUSCULAR | Status: AC
Start: 2017-09-02 — End: 2017-09-02
  Filled 2017-09-02: qty 10

## 2017-09-02 MED ORDER — SODIUM CHLORIDE 0.9 % IV SOLN
INTRAVENOUS | Status: DC
  Administered 2017-09-02: 11:00:00 via INTRAVENOUS

## 2017-09-02 MED ORDER — PROPOFOL 500 MG/50ML IV EMUL
INTRAVENOUS | Status: DC | PRN
  Administered 2017-09-02: 140 ug/kg/min via INTRAVENOUS

## 2017-09-02 MED ORDER — LIDOCAINE HCL (CARDIAC) 20 MG/ML IV SOLN
INTRAVENOUS | Status: DC | PRN
  Administered 2017-09-02: 50 mg via INTRATRACHEAL

## 2017-09-02 MED ORDER — PROPOFOL 500 MG/50ML IV EMUL
INTRAVENOUS | Status: AC
  Filled 2017-09-02: qty 50

## 2017-09-02 MED ORDER — FENTANYL CITRATE (PF) 100 MCG/2ML IJ SOLN
INTRAMUSCULAR | Status: DC | PRN
  Administered 2017-09-02 (×2): 25 ug via INTRAVENOUS

## 2017-09-02 NOTE — Transfer of Care (Signed)
Immediate Anesthesia Transfer of Care Note  Patient: Roy Little  Procedure(s) Performed: ENDOSCOPIC RETROGRADE CHOLANGIOPANCREATOGRAPHY (ERCP) (N/A )  Patient Location: PACU and Endoscopy Unit  Anesthesia Type:General  Level of Consciousness: drowsy  Airway & Oxygen Therapy: Patient Spontanous Breathing and Patient connected to nasal cannula oxygen  Post-op Assessment: Report given to RN and Post -op Vital signs reviewed and stable  Post vital signs: Reviewed and stable  Last Vitals:  Vitals:   09/02/17 1027 09/02/17 1209  BP: (!) 145/91 103/64  Pulse: 71 72  Resp: 16 19  Temp: (!) 36.2 C (!) 36.1 C  SpO2: 99% 96%    Last Pain:  Vitals:   09/02/17 1209  TempSrc: Tympanic      Patients Stated Pain Goal: 7 (07/07/48 7530)  Complications: No apparent anesthesia complications

## 2017-09-02 NOTE — H&P (Signed)
Lucilla Lame, MD [facility] [address], [place], [place] [address] Phone:[phone] Fax : [phone]  Primary Care Physician:  Paulene Floor Primary Gastroenterologist:  Dr. Allen Norris  Pre-Procedure History & Physical: HPI:  Roy Little is a 52 y.o. male is here for an ERCP.   Past Medical History:  Diagnosis Date  . 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 Depression   . GERD (gastroesophageal reflux disease)   . Hypertension     Past Surgical History:  Procedure Laterality Date  . CHOLECYSTECTOMY N/A 06/06/2017   Procedure: LAPAROSCOPIC CHOLECYSTECTOMY WITH INTRAOPERATIVE CHOLANGIOGRAM;  Surgeon: Jules Husbands, MD;  Location: [facility] ORS;  Service: General;  Laterality: N/A;  . ERCP N/A 06/10/2017   Procedure: ENDOSCOPIC RETROGRADE CHOLANGIOPANCREATOGRAPHY (ERCP);  Surgeon: Lucilla Lame, MD;  Location: [facility] ENDOSCOPY;  Service: Endoscopy;  Laterality: N/A;  . KNEE SURGERY      Prior to Admission medications   Medication Sig Start Date End Date Taking? Authorizing Provider  amLODipine (NORVASC) 10 MG tablet Take 1 tablet (10 mg total) by mouth daily. 06/30/17  Yes Carles Collet M, PA-C  atorvastatin (LIPITOR) 10 MG tablet Take 1 tablet (10 mg total) by mouth daily. 08/01/17  Yes Trinna Post, PA-C  finasteride (PROSCAR) 5 MG tablet Take 1 tablet by mouth daily. 04/29/17  Yes [provider]  lisinopril (PRINIVIL,ZESTRIL) 10 MG tablet Take 1 tablet (10 mg total) by mouth daily. 08/01/17  Yes Trinna Post, PA-C  metoprolol tartrate (LOPRESSOR) 25 MG tablet Take 1 tablet (25 mg total) by mouth 2 (two) times daily. 08/01/17 01/28/18 Yes Trinna Post, PA-C  sertraline (ZOLOFT) 100 MG tablet Take 1 tablet (100 mg total) by mouth daily. 08/01/17 10/30/17 Yes Trinna Post, PA-C    Allergies as of 08/15/2017  . (No Known Allergies)    Family History  Problem Relation Age of Onset  . Hypertension Mother   . COPD Mother   . Pancreatic cancer  Father   . COPD Maternal Grandmother   . Congestive Heart Failure Maternal Grandfather     Social History   Socioeconomic History  . Marital status: Married    Spouse name: Not on file  . Number of children: Not on file  . Years of education: Not on file  . Highest education level: Not on file  Social Needs  . Financial resource strain: Not on file  . Food insecurity - worry: Not on file  . Food insecurity - inability: Not on file  . Transportation needs - medical: Not on file  . Transportation needs - non-medical: Not on file  Occupational History  . Not on file  Tobacco Use  . Smoking status: Never Smoker  . Smokeless tobacco: Never Used  Substance and Sexual Activity  . Alcohol use: Yes    Frequency: Never    Comment: Very Rarely  . Drug use: No  . Sexual activity: Yes  Other Topics Concern  . Not on file  Social History Narrative  . Not on file    Review of Systems: See HPI, otherwise negative ROS  Physical Exam: BP (!) 145/91   Pulse 71   Temp (!) 97.2 F (36.2 C) (Tympanic)   Resp 16   Ht 5\' 9"  (1.753 m)   Wt 190 lb (86.2 kg)   SpO2 99%   BMI 28.06 kg/m  General:   Alert,  pleasant and cooperative in NAD Head:  Normocephalic and atraumatic. Neck:  Supple; no  masses or thyromegaly. Lungs:  Clear throughout to auscultation.    Heart:  Regular rate and rhythm. Abdomen:  Soft, nontender and nondistended. Normal bowel sounds, without guarding, and without rebound.   Neurologic:  Alert and  oriented x4;  grossly normal neurologically.  Impression/Plan: Roy Little is here for an ERCP to be performed for stent removal after bile duct leak  Risks, benefits, limitations, and alternatives regarding  ERCP have been reviewed with the patient.  Questions have been answered.  All parties agreeable.   Lucilla Lame, MD  09/02/2017, 11:32 AM

## 2017-09-02 NOTE — Anesthesia Preprocedure Evaluation (Signed)
Anesthesia Evaluation  Patient identified by MRN, date of birth, ID band Patient awake    Reviewed: Allergy & Precautions, H&P , NPO status , Patient's Chart, lab work & pertinent test results, reviewed documented beta blocker date and time   Airway Mallampati: II   Neck ROM: full    Dental  (+) Poor Dentition   Pulmonary neg pulmonary ROS,    Pulmonary exam normal        Cardiovascular Exercise Tolerance: Good hypertension, On Medications negative cardio ROS Normal cardiovascular exam Rhythm:regular Rate:Normal     Neuro/Psych negative neurological ROS  negative psych ROS   GI/Hepatic negative GI ROS, Neg liver ROS,   Endo/Other  negative endocrine ROS  Renal/GU negative Renal ROS  negative genitourinary   Musculoskeletal   Abdominal   Peds  Hematology negative hematology ROS (+)   Anesthesia Other Findings Past Medical History: No date: 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[place]) Past Surgical History: 06/06/2017: CHOLECYSTECTOMY; N/A     Comment:  Procedure: LAPAROSCOPIC CHOLECYSTECTOMY WITH               INTRAOPERATIVE CHOLANGIOGRAM;  Surgeon: Jules Husbands,               MD;  Location: [facility] ORS;  Service: General;  Laterality:               N/A; 06/10/2017: ERCP; N/A     Comment:  Procedure: ENDOSCOPIC RETROGRADE               CHOLANGIOPANCREATOGRAPHY (ERCP);  Surgeon: Lucilla Lame,               MD;  Location: [facility] ENDOSCOPY;  Service: Endoscopy;                Laterality: N/A; No date: KNEE SURGERY   Reproductive/Obstetrics negative OB ROS                             Anesthesia Physical Anesthesia Plan  ASA: II  Anesthesia Plan: General   Post-op Pain Management:    Induction:   PONV Risk Score and Plan:   Airway Management Planned:   Additional Equipment:   Intra-op Plan:   Post-operative Plan:   Informed Consent: I have reviewed the patients History and Physical,  chart, labs and discussed the procedure including the risks, benefits and alternatives for the proposed anesthesia with the patient or authorized representative who has indicated his/her understanding and acceptance.   Dental Advisory Given  Plan Discussed with: CRNA  Anesthesia Plan Comments:         Anesthesia Quick Evaluation

## 2017-09-02 NOTE — Op Note (Signed)
[facility] Patient Name: Rico Massar Procedure Date: 09/02/2017 11:40 AM MRN: [id number] Account #: [id number] Date of Birth: December 21, 1965 Admit Type: Outpatient Age: 52 Room: [facility] ENDO ROOM 4 Gender: Male Note Status: Finalized Procedure:            ERCP Indications:          Bile leak, Stent removal Providers:            Lucilla Lame MD, MD Referring MD:         Christian Mate. Terrilee Croak, MD (Referring MD) Medicines:            Propofol per Anesthesia Complications:        No immediate complications. Procedure:            Pre-Anesthesia Assessment:                       - Prior to the procedure, a History and Physical was                        performed, and patient medications and allergies were                        reviewed. The patient's tolerance of previous                        anesthesia was also reviewed. The risks and benefits of                        the procedure and the sedation options and risks were                        discussed with the patient. All questions were                        answered, and informed consent was obtained. Prior                        Anticoagulants: The patient has taken no previous                        anticoagulant or antiplatelet agents. ASA Grade                        Assessment: II - A patient with mild systemic disease.                        After reviewing the risks and benefits, the patient was                        deemed in satisfactory condition to undergo the                        procedure.                       After obtaining informed consent, the scope was passed                        under direct vision. Throughout the procedure, the  patient's blood pressure, pulse, and oxygen saturations                        were monitored continuously. The ERCP was introduced                        through the mouth, and used to inject contrast into and                         used to cannulate the bile duct. The ERCP was                        accomplished without difficulty. The patient tolerated                        the procedure well. Findings:      A scout film of the abdomen was obtained. One stent ending in the lower       third of the main bile duct was seen. One plastic stent originating in       the biliary tree was emerging from the major papilla. One stent was       removed from the biliary tree using a snare. The bile duct was deeply       cannulated with the short-nosed traction sphincterotome. Contrast was       injected. I personally interpreted the bile duct images. There was brisk       flow of contrast through the ducts. Image quality was excellent.       Contrast extended to the entire biliary tree. A wire was passed into the       biliary tree. The biliary tree was swept with a 15 mm balloon starting       at the bifurcation. Sludge was swept from the duct. Impression:           - One stent from the biliary tree was seen in the major                        papilla.                       - One stent was removed from the biliary tree.                       - The biliary tree was swept and sludge was found. Recommendation:       - Discharge patient to home.                       - Continue present medications. Procedure Code(s):    --- Professional ---                       [phone], Endoscopic retrograde cholangiopancreatography                        (ERCP); with removal of foreign body(s) or stent(s)                        from biliary/pancreatic duct(s)                       43264, Endoscopic retrograde cholangiopancreatography                        (  ERCP); with removal of calculi/debris from                        biliary/pancreatic duct(s)                       [phone], Endoscopic catheterization of the biliary ductal                        system, radiological supervision and interpretation Diagnosis Code(s):    --- Professional  ---                       K83.8, Other specified diseases of biliary tract                       Z46.59, Encounter for fitting and adjustment of other                        gastrointestinal appliance and device CPT copyright 2016 American Medical Association. All rights reserved. The codes documented in this report are preliminary and upon coder review may  be revised to meet current compliance requirements. Lucilla Lame MD, MD 09/02/2017 12:05:39 PM This report has been signed electronically. Number of Addenda: 0 Note Initiated On: 09/02/2017 11:40 AM      [facility]

## 2017-09-02 NOTE — Anesthesia Post-op Follow-up Note (Signed)
Anesthesia QCDR form completed.        

## 2017-09-03 NOTE — Anesthesia Postprocedure Evaluation (Signed)
Anesthesia Post Note  Patient: Roy Little  Procedure(s) Performed: ENDOSCOPIC RETROGRADE CHOLANGIOPANCREATOGRAPHY (ERCP) (N/A )  Patient location during evaluation: PACU Anesthesia Type: General Level of consciousness: awake and alert Pain management: pain level controlled Vital Signs Assessment: post-procedure vital signs reviewed and stable Respiratory status: spontaneous breathing, nonlabored ventilation, respiratory function stable and patient connected to nasal cannula oxygen Cardiovascular status: blood pressure returned to baseline and stable Postop Assessment: no apparent nausea or vomiting Anesthetic complications: no     Last Vitals:  Vitals:   09/02/17 1219 09/02/17 1229  BP: 98/71 105/79  Pulse: 67 63  Resp: 12 19  Temp:    SpO2: 94% 96%    Last Pain:  Vitals:   09/02/17 1209  TempSrc: Tympanic                 Molli Barrows

## 2017-09-05 ENCOUNTER — Other Ambulatory Visit: Payer: Self-pay | Admitting: Physician Assistant

## 2017-09-05 ENCOUNTER — Encounter: Payer: Self-pay | Admitting: Physician Assistant

## 2017-09-05 ENCOUNTER — Ambulatory Visit (INDEPENDENT_AMBULATORY_CARE_PROVIDER_SITE_OTHER): Payer: 59 | Admitting: Physician Assistant

## 2017-09-05 VITALS — BP 138/82 | HR 74 | Temp 98.7°F | Resp 16 | Wt 191.0 lb

## 2017-09-05 DIAGNOSIS — I10 Essential (primary) hypertension: Secondary | ICD-10-CM

## 2017-09-05 DIAGNOSIS — F419 Anxiety disorder, unspecified: Secondary | ICD-10-CM | POA: Diagnosis not present

## 2017-09-05 DIAGNOSIS — E785 Hyperlipidemia, unspecified: Secondary | ICD-10-CM

## 2017-09-05 NOTE — Progress Notes (Signed)
Patient: Roy Little Male    DOB: 19-Dec-1965   52 y.o.   MRN: [id number] Visit Date: 09/05/2017  Today's Provider: Trinna Post, PA-C   Chief Complaint  Patient presents with  . Hypertension  . Hyperlipidemia  . Depression   Subjective:    HPI   Hypertension, follow-up:  BP Readings from Last 3 Encounters:  09/05/17 138/82  09/02/17 105/79  08/01/17 (!) 162/108    He was last seen for hypertension 1 months ago.  BP at that visit was 162/108. Management since that visit includes adding Lisinopril 10mg  qd .He reports excellent compliance with treatment. He is not having side effects.  He is not exercising, patient states that he recently had surgery but will resume exercising  2-3x a week . He is adherent to low salt diet.  -- Outside blood pressures are systolic reading in 465K and diastolic 80. He is experiencing none.  Patient denies chest pain, chest pressure/discomfort, claudication, dyspnea, exertional chest pressure/discomfort, fatigue, irregular heart beat, lower extremity edema, near-syncope, orthopnea, palpitations, paroxysmal nocturnal dyspnea, syncope and tachypnea.   Cardiovascular risk factors include advanced age (older than 45 for men, 103 for women), hypertension and male gender.  Use of agents associated with hypertension: none.   ------------------------------------------------------------------------    Lipid/Cholesterol, Follow-up:   Last seen for this 1 months ago.  Management since that visit includes starting Atorvastatin 10mg  qd.  Last Lipid Panel:    Component Value Date/Time   CHOL 247 (H) 06/30/2017 1608   TRIG 261 (H) 06/30/2017 1608   HDL 40 06/30/2017 1608   CHOLHDL 6.2 (H) 06/30/2017 1608   LDLCALC 155 (H) 06/30/2017 1608    He reports excellent compliance with treatment. He is not having side effects.   Wt Readings from Last 3 Encounters:  09/05/17 191 lb (86.6 kg)  09/02/17 190 lb (86.2 kg)  08/01/17 192 lb  (87.1 kg)    ------------------------------------------------------------------------  Depression, Follow-up  He  was last seen for this 1 months ago. Changes made at last visit include increasing Zoloft 100mg .   He reports excellent compliance with treatment. He is not having side effects.   He reports good tolerance of treatment. Current symptoms include: none He feels he is Improved since last visit.  ------------------------------------------------------------------------    No Known Allergies   Current Outpatient Medications:  .  amLODipine (NORVASC) 10 MG tablet, Take 1 tablet (10 mg total) by mouth daily., Disp: 90 tablet, Rfl: 3 .  atorvastatin (LIPITOR) 10 MG tablet, Take 1 tablet (10 mg total) by mouth daily., Disp: 90 tablet, Rfl: 3 .  finasteride (PROSCAR) 5 MG tablet, Take 1 tablet by mouth daily., Disp: , Rfl:  .  lisinopril (PRINIVIL,ZESTRIL) 10 MG tablet, Take 1 tablet (10 mg total) by mouth daily., Disp: 90 tablet, Rfl: 3 .  metoprolol tartrate (LOPRESSOR) 25 MG tablet, Take 1 tablet (25 mg total) by mouth 2 (two) times daily., Disp: 180 tablet, Rfl: 1 .  sertraline (ZOLOFT) 100 MG tablet, Take 1 tablet (100 mg total) by mouth daily., Disp: 30 tablet, Rfl: 2  Review of Systems  Constitutional: Negative.   HENT: Negative.   Respiratory: Negative.   Cardiovascular: Negative.   Gastrointestinal: Negative.   Psychiatric/Behavioral: Negative.     Social History   Tobacco Use  . Smoking status: Never Smoker  . Smokeless tobacco: Never Used  Substance Use Topics  . Alcohol use: Yes    Frequency: Never    Comment:  Very Rarely   Objective:   BP 138/82   Pulse 74   Temp 98.7 F (37.1 C) (Oral)   Resp 16   Wt 191 lb (86.6 kg)   SpO2 99%   BMI 28.21 kg/m  Vitals:   09/05/17 1616  BP: 138/82  Pulse: 74  Resp: 16  Temp: 98.7 F (37.1 C)  TempSrc: Oral  SpO2: 99%  Weight: 191 lb (86.6 kg)     Physical Exam  Constitutional: He is oriented to  person, place, and time. He appears well-developed and well-nourished.  Cardiovascular: Normal rate and regular rhythm.  Pulmonary/Chest: Effort normal and breath sounds normal.  Neurological: He is alert and oriented to person, place, and time.  Skin: Skin is warm and dry.  Psychiatric: He has a normal mood and affect. His behavior is normal.        Assessment & Plan:     1. Hypertension, unspecified type  BP improved, check CMET after initiation of Lisinopril.   - Comprehensive Metabolic Panel (CMET)  2. Hyperlipidemia, unspecified hyperlipidemia type  Started Lipitor last month, Will have patient get this follow up lab in 2 weeks. Follow up in clinic 2 months.  3. Anxiety  Improved with 100 mg zoloft qd. Continue this medication.  Return in about 2 months (around 11/03/2017) for HTN, HLD.  The entirety of the information documented in the History of Present Illness, Review of Systems and Physical Exam were personally obtained by me. Portions of this information were initially documented by Ashley Royalty, CMA and reviewed by me for thoroughness and accuracy.           Trinna Post, PA-C  [facility]

## 2017-09-05 NOTE — Patient Instructions (Signed)
Get cholesterol lab in 2 weeks time.   Hypertension Hypertension is another name for high blood pressure. High blood pressure forces your heart to work harder to pump blood. This can cause problems over time. There are two numbers in a blood pressure reading. There is a top number (systolic) over a bottom number (diastolic). It is best to have a blood pressure below 120/80. Healthy choices can help lower your blood pressure. You may need medicine to help lower your blood pressure if:  Your blood pressure cannot be lowered with healthy choices.  Your blood pressure is higher than 130/80.  Follow these instructions at home: Eating and drinking  If directed, follow the DASH eating plan. This diet includes: ? Filling half of your plate at each meal with fruits and vegetables. ? Filling one quarter of your plate at each meal with whole grains. Whole grains include whole wheat pasta, brown rice, and whole grain bread. ? Eating or drinking low-fat dairy products, such as skim milk or low-fat yogurt. ? Filling one quarter of your plate at each meal with low-fat (lean) proteins. Low-fat proteins include fish, skinless chicken, eggs, beans, and tofu. ? Avoiding fatty meat, cured and processed meat, or chicken with skin. ? Avoiding premade or processed food.  Eat less than 1,500 mg of salt (sodium) a day.  Limit alcohol use to no more than 1 drink a day for nonpregnant women and 2 drinks a day for men. One drink equals 12 oz of beer, 5 oz of wine, or 1 oz of hard liquor. Lifestyle  Work with your doctor to stay at a healthy weight or to lose weight. Ask your doctor what the best weight is for you.  Get at least 30 minutes of exercise that causes your heart to beat faster (aerobic exercise) most days of the week. This may include walking, swimming, or biking.  Get at least 30 minutes of exercise that strengthens your muscles (resistance exercise) at least 3 days a week. This may include lifting  weights or pilates.  Do not use any products that contain nicotine or tobacco. This includes cigarettes and e-cigarettes. If you need help quitting, ask your doctor.  Check your blood pressure at home as told by your doctor.  Keep all follow-up visits as told by your doctor. This is important. Medicines  Take over-the-counter and prescription medicines only as told by your doctor. Follow directions carefully.  Do not skip doses of blood pressure medicine. The medicine does not work as well if you skip doses. Skipping doses also puts you at risk for problems.  Ask your doctor about side effects or reactions to medicines that you should watch for. Contact a doctor if:  You think you are having a reaction to the medicine you are taking.  You have headaches that keep coming back (recurring).  You feel dizzy.  You have swelling in your ankles.  You have trouble with your vision. Get help right away if:  You get a very bad headache.  You start to feel confused.  You feel weak or numb.  You feel faint.  You get very bad pain in your: ? Chest. ? Belly (abdomen).  You throw up (vomit) more than once.  You have trouble breathing. Summary  Hypertension is another name for high blood pressure.  Making healthy choices can help lower blood pressure. If your blood pressure cannot be controlled with healthy choices, you may need to take medicine. This information is not intended to  replace advice given to you by your health care provider. Make sure you discuss any questions you have with your health care provider. Document Released: 12/18/2007 Document Revised: 05/29/2016 Document Reviewed: 05/29/2016 Elsevier Interactive Patient Education  [employer].

## 2017-09-06 LAB — COMPREHENSIVE METABOLIC PANEL
ALT: 27 IU/L (ref 0–44)
AST: 24 IU/L (ref 0–40)
Albumin/Globulin Ratio: 2 (ref 1.2–2.2)
Albumin: 4.5 g/dL (ref 3.5–5.5)
Alkaline Phosphatase: 113 IU/L (ref 39–117)
BUN/Creatinine Ratio: 19 (ref 9–20)
BUN: 18 mg/dL (ref 6–24)
Bilirubin Total: 0.4 mg/dL (ref 0.0–1.2)
CO2: 23 mmol/L (ref 20–29)
Calcium: 8.9 mg/dL (ref 8.7–10.2)
Chloride: 104 mmol/L (ref 96–106)
Creatinine, Ser: 0.95 mg/dL (ref 0.76–1.27)
GFR calc Af Amer: 105 mL/min/{1.73_m2} (ref >59)
GFR calc non Af Amer: 91 mL/min/{1.73_m2} (ref >59)
Globulin, Total: 2.2 g/dL (ref 1.5–4.5)
Glucose: 95 mg/dL (ref 65–99)
Potassium: 4.4 mmol/L (ref 3.5–5.2)
Sodium: 140 mmol/L (ref 134–144)
Total Protein: 6.7 g/dL (ref 6.0–8.5)

## 2017-09-09 ENCOUNTER — Telehealth: Payer: Self-pay

## 2017-09-09 NOTE — Telephone Encounter (Signed)
Pt advised.   Thanks,   -Vernadine Coombs  

## 2017-09-09 NOTE — Telephone Encounter (Signed)
-----   Message from Trinna Post, [place] sent at 09/08/2017  1:22 PM EST ----- CMET normal.

## 2017-09-09 NOTE — Telephone Encounter (Signed)
LMTCB 09/09/2017  Thanks,   -Mickel Baas

## 2017-09-19 DIAGNOSIS — E785 Hyperlipidemia, unspecified: Secondary | ICD-10-CM | POA: Diagnosis not present

## 2017-09-20 LAB — LIPID PANEL
Chol/HDL Ratio: 5 ratio (ref 0.0–5.0)
Cholesterol, Total: 198 mg/dL (ref 100–199)
HDL: 40 mg/dL (ref >39)
LDL Calculated: 89 mg/dL (ref 0–99)
Triglycerides: 345 mg/dL — ABNORMAL HIGH (ref 0–149)
VLDL Cholesterol Cal: 69 mg/dL — ABNORMAL HIGH (ref 5–40)

## 2017-09-22 ENCOUNTER — Telehealth: Payer: Self-pay

## 2017-09-22 NOTE — Telephone Encounter (Signed)
Pt advised.   Thanks,   -Marquavion Venhuizen  

## 2017-09-22 NOTE — Telephone Encounter (Signed)
-----   Message from Trinna Post, [place] sent at 09/22/2017  2:56 PM EDT ----- Cholesterol has come under great control. Continue statin and other medications.

## 2017-10-13 ENCOUNTER — Other Ambulatory Visit: Payer: Self-pay | Admitting: Physician Assistant

## 2017-10-13 DIAGNOSIS — F329 Major depressive disorder, single episode, unspecified: Secondary | ICD-10-CM

## 2017-10-13 DIAGNOSIS — F32A Depression, unspecified: Secondary | ICD-10-CM

## 2017-10-13 DIAGNOSIS — F419 Anxiety disorder, unspecified: Principal | ICD-10-CM

## 2017-10-13 MED ORDER — SERTRALINE HCL 100 MG PO TABS: 100.0000 mg | ORAL_TABLET | Freq: Every day | ORAL | 0 refills | Status: DC

## 2017-10-13 NOTE — Telephone Encounter (Signed)
[facility] faxed a refill request for the following medication. Thanks CC  sertraline (ZOLOFT) 100 MG tablet

## 2017-11-07 ENCOUNTER — Ambulatory Visit: Payer: Self-pay | Admitting: Physician Assistant

## 2017-11-11 ENCOUNTER — Encounter: Payer: Self-pay | Admitting: Physician Assistant

## 2017-11-11 ENCOUNTER — Ambulatory Visit (INDEPENDENT_AMBULATORY_CARE_PROVIDER_SITE_OTHER): Payer: 59 | Admitting: Physician Assistant

## 2017-11-11 VITALS — BP 152/80 | HR 76 | Temp 98.4°F | Resp 16 | Wt 191.0 lb

## 2017-11-11 DIAGNOSIS — F32A Depression, unspecified: Secondary | ICD-10-CM

## 2017-11-11 DIAGNOSIS — I10 Essential (primary) hypertension: Secondary | ICD-10-CM | POA: Diagnosis not present

## 2017-11-11 DIAGNOSIS — F329 Major depressive disorder, single episode, unspecified: Secondary | ICD-10-CM

## 2017-11-11 DIAGNOSIS — E785 Hyperlipidemia, unspecified: Secondary | ICD-10-CM

## 2017-11-11 MED ORDER — LISINOPRIL 20 MG PO TABS: 20.0000 mg | ORAL_TABLET | Freq: Every day | ORAL | 0 refills | Status: DC

## 2017-11-11 NOTE — Patient Instructions (Signed)

## 2017-11-11 NOTE — Progress Notes (Signed)
Patient: Roy Little Male    DOB: [birth year]   52 y.o.   MRN: [id number] Visit Date: 11/11/2017  Today's Provider: Trinna Post, PA-C   Chief Complaint  Patient presents with  . Hypertension  . Hyperlipidemia  . Back Pain    Started over the weekend playing golf.   Subjective:    Roy Little is a 52 y/o man presenting today for follow up of HTN, HLD, Depression. Doing well with medications. Played multiple rounds of golf this weekend and is extremely sore and stiff.   Hypertension  This is a chronic problem. The problem is unchanged. The problem is controlled. Pertinent negatives include no anxiety, blurred vision, chest pain, headaches, malaise/fatigue, neck pain, orthopnea, palpitations, peripheral edema, PND, shortness of breath or sweats. There are no associated agents to hypertension. Past treatments include ACE inhibitors, beta blockers and calcium channel blockers. The current treatment provides moderate improvement. There are no compliance problems.   Hyperlipidemia  This is a chronic problem. The problem is controlled. Recent lipid tests were reviewed and are normal. Pertinent negatives include no chest pain, focal sensory loss, focal weakness, leg pain, myalgias or shortness of breath. Current antihyperlipidemic treatment includes statins. There are no compliance problems.   Depression         This is a chronic problem.The problem is unchanged.  Associated symptoms include decreased concentration.  Associated symptoms include no fatigue, no helplessness, no hopelessness, does not have insomnia, not irritable, no restlessness, no decreased interest, no appetite change, no body aches, no myalgias, no headaches, no indigestion, not sad and no suicidal ideas.  Past treatments include SSRIs - Selective serotonin reuptake inhibitors.  Compliance with treatment is good.  Previous treatment provided moderate relief.   Pertinent negatives include no anxiety. Back Pain    This is a new problem. The current episode started in the past 7 days. The problem occurs constantly. The problem is unchanged. The pain is present in the lumbar spine. The pain does not radiate. The symptoms are aggravated by standing, sitting, position, twisting and bending. Pertinent negatives include no abdominal pain, bladder incontinence, bowel incontinence, chest pain, dysuria, fever, headaches, leg pain, numbness, paresis, paresthesias, pelvic pain, perianal numbness, tingling, weakness or weight loss. He has tried NSAIDs and heat for the symptoms.    No Known Allergies   Current Outpatient Medications:  .  amLODipine (NORVASC) 10 MG tablet, Take 1 tablet (10 mg total) by mouth daily., Disp: 90 tablet, Rfl: 3 .  atorvastatin (LIPITOR) 10 MG tablet, Take 1 tablet (10 mg total) by mouth daily., Disp: 90 tablet, Rfl: 3 .  finasteride (PROSCAR) 5 MG tablet, Take 1 tablet by mouth daily., Disp: , Rfl:  .  lisinopril (PRINIVIL,ZESTRIL) 10 MG tablet, Take 1 tablet (10 mg total) by mouth daily., Disp: 90 tablet, Rfl: 3 .  metoprolol tartrate (LOPRESSOR) 25 MG tablet, Take 1 tablet (25 mg total) by mouth 2 (two) times daily., Disp: 180 tablet, Rfl: 1 .  sertraline (ZOLOFT) 100 MG tablet, Take 1 tablet (100 mg total) by mouth daily., Disp: 90 tablet, Rfl: 0  Review of Systems  Constitutional: Negative.  Negative for appetite change, fatigue, fever, malaise/fatigue and weight loss.  Eyes: Negative for blurred vision.  Respiratory: Negative.  Negative for shortness of breath.   Cardiovascular: Negative for chest pain, palpitations, orthopnea, leg swelling and PND.  Gastrointestinal: Negative.  Negative for abdominal pain and bowel incontinence.  Genitourinary: Negative for bladder  incontinence, dysuria and pelvic pain.  Musculoskeletal: Positive for back pain and gait problem. Negative for arthralgias, joint swelling, myalgias, neck pain and neck stiffness.  Neurological: Negative for dizziness,  tingling, focal weakness, weakness, light-headedness, numbness, headaches and paresthesias.  Psychiatric/Behavioral: Positive for decreased concentration and depression. Negative for suicidal ideas. The patient does not have insomnia.     Social History   Tobacco Use  . Smoking status: Never Smoker  . Smokeless tobacco: Never Used  Substance Use Topics  . Alcohol use: Yes    Frequency: Never    Comment: Very Rarely   Objective:   BP (!) 152/80 (BP Location: Left Arm, Patient Position: Sitting, Cuff Size: Large)   Pulse 76   Temp 98.4 F (36.9 C) (Oral)   Resp 16   Wt 191 lb (86.6 kg)   BMI 28.21 kg/m  Vitals:   11/11/17 1634  BP: (!) 152/80  Pulse: 76  Resp: 16  Temp: 98.4 F (36.9 C)  TempSrc: Oral  Weight: 191 lb (86.6 kg)     Physical Exam  Constitutional: He is oriented to person, place, and time. He appears well-developed and well-nourished. He is not irritable.  Cardiovascular: Normal rate and regular rhythm.  Pulmonary/Chest: Effort normal and breath sounds normal.  Neurological: He is alert and oriented to person, place, and time.  Skin: Skin is warm and dry.  Psychiatric: He has a normal mood and affect. His behavior is normal.        Assessment & Plan:     1. Essential hypertension  Increase Lisinopril to 20 mg daily due to BP being elevated today and was on the higher end last visit.   - lisinopril (PRINIVIL,ZESTRIL) 20 MG tablet; Take 1 tablet (20 mg total) by mouth daily.  Dispense: 90 tablet; Refill: 0  2. Hyperlipidemia, unspecified hyperlipidemia type  Continue Statin.  3. Depression, unspecified depression type  Continue zoloft.  Return in about 3 months (around 02/10/2018) for HTN, HLD .  The entirety of the information documented in the History of Present Illness, Review of Systems and Physical Exam were personally obtained by me. Portions of this information were initially documented by Ashley Royalty, CMA and reviewed by me for  thoroughness and accuracy.          Trinna Post, PA-C  [facility]

## 2017-11-12 DIAGNOSIS — E785 Hyperlipidemia, unspecified: Secondary | ICD-10-CM | POA: Insufficient documentation

## 2017-11-12 DIAGNOSIS — F329 Major depressive disorder, single episode, unspecified: Secondary | ICD-10-CM | POA: Insufficient documentation

## 2017-11-12 DIAGNOSIS — F32A Depression, unspecified: Secondary | ICD-10-CM | POA: Insufficient documentation

## 2017-11-24 ENCOUNTER — Telehealth: Payer: Self-pay

## 2017-11-24 DIAGNOSIS — M545 Low back pain, unspecified: Secondary | ICD-10-CM

## 2017-11-24 NOTE — Telephone Encounter (Signed)
Patient called saying that he has inflammation in his lower back, and he discussed this with you at his last appt. He is still having symptoms, and is wondering if a prednisone taper can be called into the pharmacy? He uses [facility] [address]. Thanks!

## 2017-11-25 MED ORDER — PREDNISONE 10 MG PO TABS: ORAL_TABLET | ORAL | 0 refills | Status: DC

## 2017-11-25 NOTE — Telephone Encounter (Signed)
Sent 6 day prednisone taper to [facility]. Do not take with NSAIDs like ibuprofen, aleve, etc. Follow up if not improving.

## 2017-11-25 NOTE — Telephone Encounter (Signed)
[id number] 

## 2017-11-27 ENCOUNTER — Other Ambulatory Visit: Payer: Self-pay | Admitting: Physician Assistant

## 2017-11-27 DIAGNOSIS — I10 Essential (primary) hypertension: Secondary | ICD-10-CM

## 2017-11-27 NOTE — Telephone Encounter (Signed)
Left message advising pt.   Thanks,   -Laura  

## 2017-12-27 ENCOUNTER — Other Ambulatory Visit: Payer: Self-pay | Admitting: Physician Assistant

## 2017-12-27 DIAGNOSIS — F419 Anxiety disorder, unspecified: Principal | ICD-10-CM

## 2017-12-27 DIAGNOSIS — F329 Major depressive disorder, single episode, unspecified: Secondary | ICD-10-CM

## 2017-12-27 DIAGNOSIS — F32A Depression, unspecified: Secondary | ICD-10-CM

## 2018-02-10 ENCOUNTER — Ambulatory Visit: Payer: 59 | Admitting: Physician Assistant

## 2018-02-10 ENCOUNTER — Encounter: Payer: Self-pay | Admitting: Physician Assistant

## 2018-02-10 VITALS — BP 142/92 | HR 70 | Temp 98.8°F | Wt 193.0 lb

## 2018-02-10 DIAGNOSIS — I10 Essential (primary) hypertension: Secondary | ICD-10-CM | POA: Diagnosis not present

## 2018-02-10 DIAGNOSIS — M545 Low back pain, unspecified: Secondary | ICD-10-CM

## 2018-02-10 MED ORDER — LISINOPRIL 40 MG PO TABS: 40.0000 mg | ORAL_TABLET | Freq: Every day | ORAL | 0 refills | Status: DC

## 2018-02-10 MED ORDER — CYCLOBENZAPRINE HCL 10 MG PO TABS: 10.0000 mg | ORAL_TABLET | Freq: Every day | ORAL | 0 refills | Status: DC

## 2018-02-10 NOTE — Progress Notes (Signed)
Patient: Roy Little Male    DOB: 02/21/1966   52 y.o.   MRN: [id number] Visit Date: 02/11/2018  Today's Provider: Trinna Post, PA-C   Chief Complaint  Patient presents with  . Hypertension  . Hyperlipidemia   Subjective:    Hypertension  This is a chronic problem. Pertinent negatives include no anxiety, blurred vision, chest pain, headaches, malaise/fatigue, neck pain, orthopnea, palpitations, peripheral edema, PND, shortness of breath or sweats. There are no associated agents to hypertension. Past treatments include beta blockers, calcium channel blockers and ACE inhibitors. There are no compliance problems.   Hyperlipidemia  This is a chronic problem. The problem is controlled. Pertinent negatives include no chest pain, myalgias or shortness of breath. Current antihyperlipidemic treatment includes statins. The current treatment provides significant improvement of lipids. There are no compliance problems.    Back Pain  Had episode of low back pain last visit after golfing. Resolved with prednisone but is now flaring up again, increases when he turns to the left. Denies radiating pain down his legs, weakness.   Lab Results  Component Value Date   CHOL 198 09/19/2017   CHOL 247 (H) 06/30/2017   Lab Results  Component Value Date   HDL 40 09/19/2017   HDL 40 06/30/2017   Lab Results  Component Value Date   LDLCALC 89 09/19/2017   LDLCALC 155 (H) 06/30/2017   Lab Results  Component Value Date   TRIG 345 (H) 09/19/2017   TRIG 261 (H) 06/30/2017   Lab Results  Component Value Date   CHOLHDL 5.0 09/19/2017   CHOLHDL 6.2 (H) 06/30/2017   No results found for: LDLDIRECT  BP Readings from Last 3 Encounters:  02/10/18 (!) 142/92  11/11/17 (!) 152/80  09/05/17 138/82       No Known Allergies   Current Outpatient Medications:  .  amLODipine (NORVASC) 10 MG tablet, Take 1 tablet (10 mg total) by mouth daily., Disp: 90 tablet, Rfl: 3 .  atorvastatin  (LIPITOR) 10 MG tablet, Take 1 tablet (10 mg total) by mouth daily., Disp: 90 tablet, Rfl: 3 .  finasteride (PROSCAR) 5 MG tablet, Take 1 tablet by mouth daily., Disp: , Rfl:  .  metoprolol tartrate (LOPRESSOR) 25 MG tablet, TAKE 1 TABLET(25 MG) BY MOUTH TWICE DAILY, Disp: 180 tablet, Rfl: 0 .  sertraline (ZOLOFT) 100 MG tablet, TAKE 1 TABLET(100 MG) BY MOUTH DAILY, Disp: 90 tablet, Rfl: 0 .  cyclobenzaprine (FLEXERIL) 10 MG tablet, Take 1 tablet (10 mg total) by mouth at bedtime., Disp: 30 tablet, Rfl: 0 .  lisinopril (PRINIVIL,ZESTRIL) 40 MG tablet, Take 1 tablet (40 mg total) by mouth daily., Disp: 90 tablet, Rfl: 0 .  predniSONE (DELTASONE) 10 MG tablet, Take 6 pills on day 1, 5 pills on day 2, and so on until complete., Disp: 21 tablet, Rfl: 0  Review of Systems  Constitutional: Negative.  Negative for malaise/fatigue.  Eyes: Negative for blurred vision.  Respiratory: Negative.  Negative for shortness of breath.   Cardiovascular: Negative.  Negative for chest pain, palpitations, orthopnea and PND.  Gastrointestinal: Negative.   Musculoskeletal: Positive for back pain. Negative for arthralgias, gait problem, joint swelling, myalgias, neck pain and neck stiffness.  Neurological: Negative for dizziness, light-headedness and headaches.    Social History   Tobacco Use  . Smoking status: Never Smoker  . Smokeless tobacco: Never Used  Substance Use Topics  . Alcohol use: Yes    Frequency: Never  Comment: Very Rarely   Objective:   BP (!) 142/92 (BP Location: Left Arm, Patient Position: Sitting, Cuff Size: Large)   Pulse 70   Temp 98.8 F (37.1 C) (Oral)   Wt 193 lb (87.5 kg)   SpO2 97%   BMI 28.50 kg/m  Vitals:   02/10/18 1626  BP: (!) 142/92  Pulse: 70  Temp: 98.8 F (37.1 C)  TempSrc: Oral  SpO2: 97%  Weight: 193 lb (87.5 kg)     Physical Exam  Constitutional: He is oriented to person, place, and time. He appears well-developed and well-nourished.  Cardiovascular:  Normal rate and regular rhythm.  Pulmonary/Chest: Effort normal and breath sounds normal.  Musculoskeletal:       Lumbar back: He exhibits pain. He exhibits normal range of motion, no tenderness and no bony tenderness.  Neurological: He is alert and oriented to person, place, and time.  Skin: Skin is warm and dry.        Assessment & Plan:     1. Acute bilateral low back pain without sciatica  Sounds MSK but does have history of cancer and would like to r/o any bony involvement.   - DG Lumbar Spine Complete; Future - cyclobenzaprine (FLEXERIL) 10 MG tablet; Take 1 tablet (10 mg total) by mouth at bedtime.  Dispense: 30 tablet; Refill: 0  2. Hypertension, unspecified type  Increased as below. See him back in 4 mo for CPE/follow up.   - lisinopril (PRINIVIL,ZESTRIL) 40 MG tablet; Take 1 tablet (40 mg total) by mouth daily.  Dispense: 90 tablet; Refill: 0  Return in about 4 months (around 06/13/2018) for CPE/HTN.  The entirety of the information documented in the History of Present Illness, Review of Systems and Physical Exam were personally obtained by me. Portions of this information were initially documented by Ashley Royalty, CMA and reviewed by me for thoroughness and accuracy.        Trinna Post, PA-C  [facility]

## 2018-02-11 ENCOUNTER — Ambulatory Visit
Admission: RE | Admit: 2018-02-11 | Discharge: 2018-02-11 | Disposition: A | Discharge status: Home / Self Care | Payer: 59 | Source: Ambulatory Visit | Attending: Physician Assistant | Admitting: Physician Assistant

## 2018-02-11 DIAGNOSIS — M5136 Other intervertebral disc degeneration, lumbar region: Secondary | ICD-10-CM | POA: Insufficient documentation

## 2018-02-11 DIAGNOSIS — M545 Low back pain, unspecified: Secondary | ICD-10-CM

## 2018-02-12 ENCOUNTER — Telehealth: Payer: Self-pay

## 2018-02-12 NOTE — Telephone Encounter (Signed)
LMTCB. Results and result note released to My Chart.

## 2018-02-12 NOTE — Telephone Encounter (Signed)
-----   Message from Trinna Post, [place] sent at 02/12/2018  9:05 AM EDT ----- There is some disc space narrowing in low back and also some arthritic changes, but no bony lesion. Continue trial of NSAIDs and flexeril.

## 2018-02-25 ENCOUNTER — Other Ambulatory Visit: Payer: Self-pay | Admitting: Physician Assistant

## 2018-02-25 DIAGNOSIS — I10 Essential (primary) hypertension: Secondary | ICD-10-CM

## 2018-03-27 ENCOUNTER — Other Ambulatory Visit: Payer: Self-pay | Admitting: Physician Assistant

## 2018-03-27 DIAGNOSIS — F32A Depression, unspecified: Secondary | ICD-10-CM

## 2018-03-27 DIAGNOSIS — F329 Major depressive disorder, single episode, unspecified: Secondary | ICD-10-CM

## 2018-03-27 DIAGNOSIS — F419 Anxiety disorder, unspecified: Principal | ICD-10-CM

## 2018-05-08 ENCOUNTER — Other Ambulatory Visit: Payer: Self-pay | Admitting: Physician Assistant

## 2018-05-08 DIAGNOSIS — I10 Essential (primary) hypertension: Secondary | ICD-10-CM

## 2018-05-17 IMAGING — XA DG CHOLANGIOGRAM OPERATIVE
9 of 11 series · 14 of 24 positions shown · non-contrast
Comparison: CT 06/06/2017

CLINICAL DATA: 51-year-old male with a history of cholelithiasis

EXAM:
INTRAOPERATIVE CHOLANGIOGRAM
TECHNIQUE: Cholangiographic images from the C-arm fluoroscopic device were
submitted for interpretation post-operatively. Please see the
procedural report for the amount of contrast and the fluoroscopy
time utilized.

[Series 1: cont. · 2 of 38 frames shown (1 of 9)]
[frame 6/38]
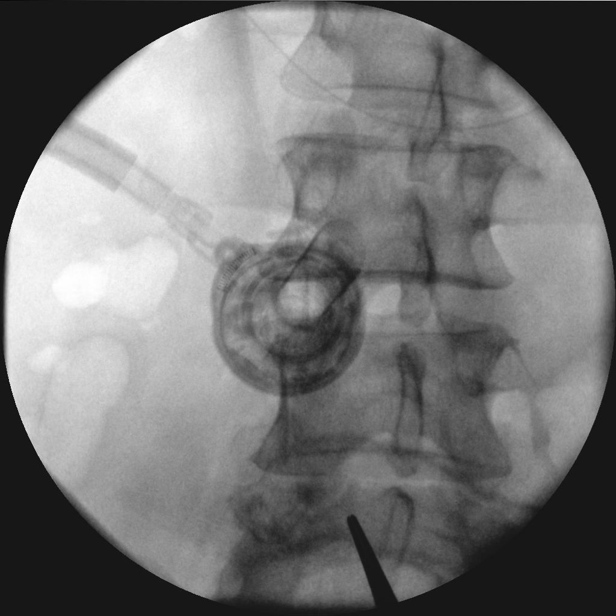
[frame 38/38]
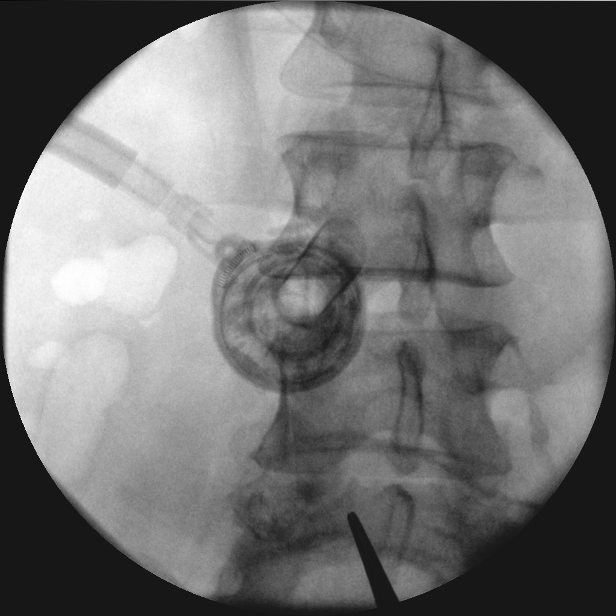

[Series 3: cont. · 2 of 42 frames shown (2 of 9)]
[frame 7/42]
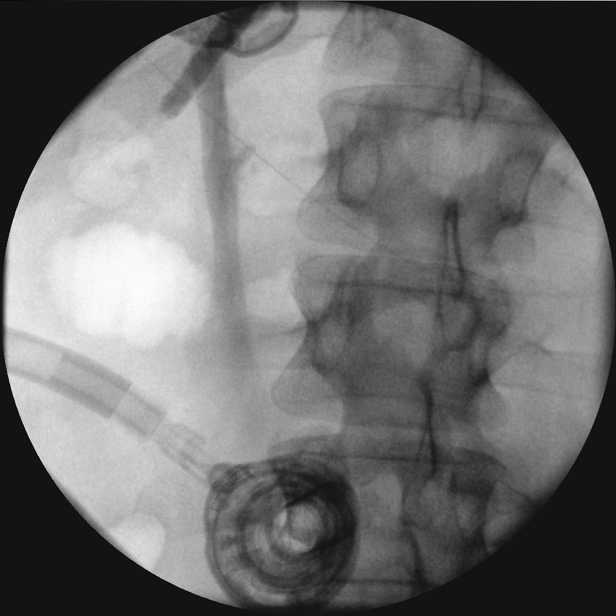
[frame 36/42]
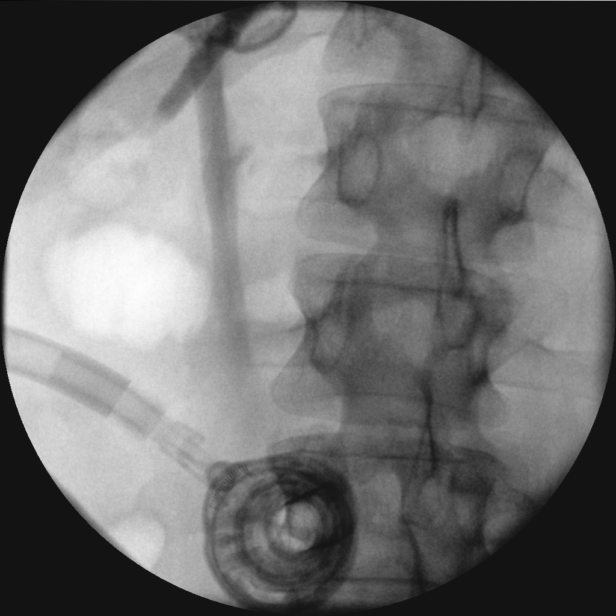

[Series 4: cont. · 1 of 81 frames shown (3 of 9)]
[frame 13/81]
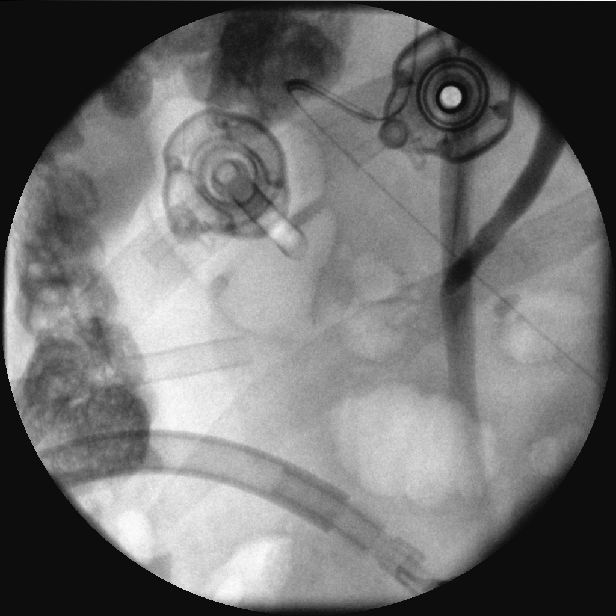

[Series 5: cont. · 1 of 2 frames shown (4 of 9)]
[frame 1/2]
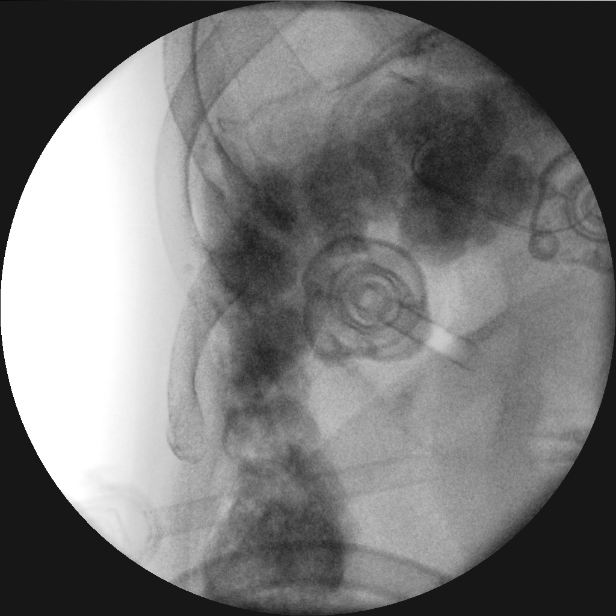

[Series 6: cont. · 2 of 32 frames shown (5 of 9)]
[frame 17/32]
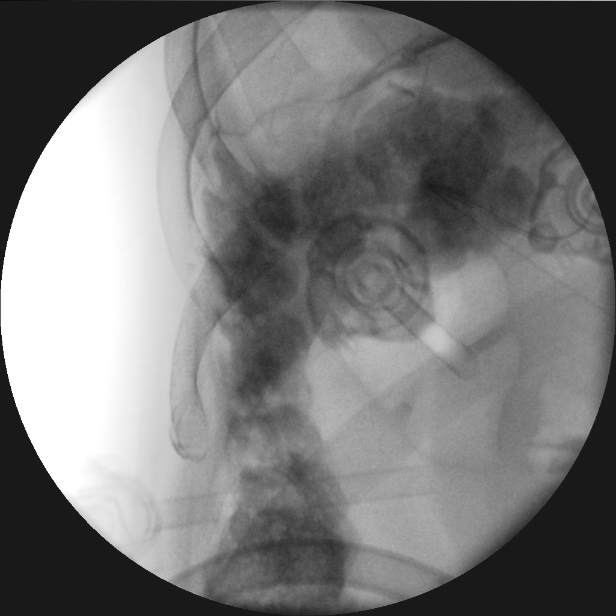
[frame 28/32]
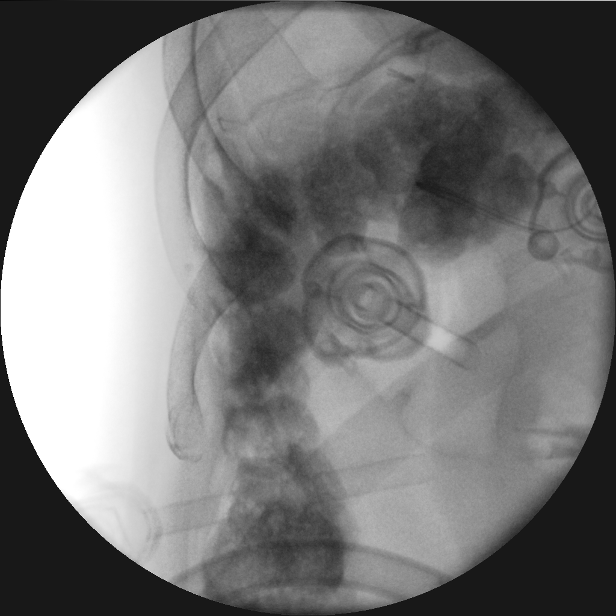

[Series 7: cont. · 1 of 33 frames shown (6 of 9)]
[frame 29/33]
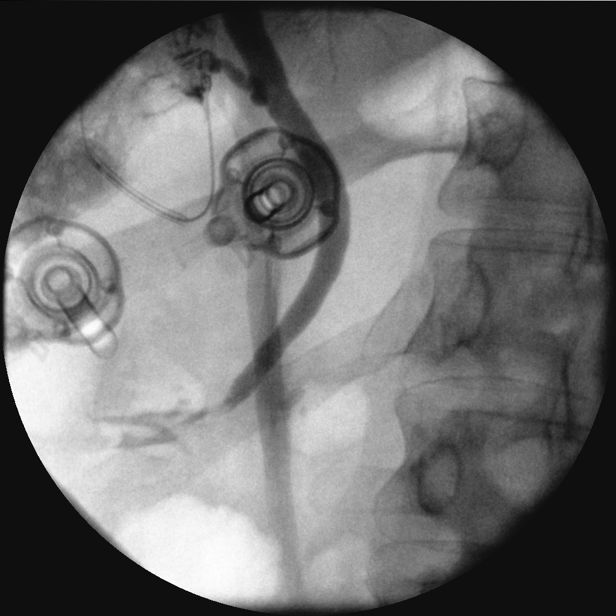

[Series 8: cont. · 1 of 37 frames shown (7 of 9)]
[frame 19/37]
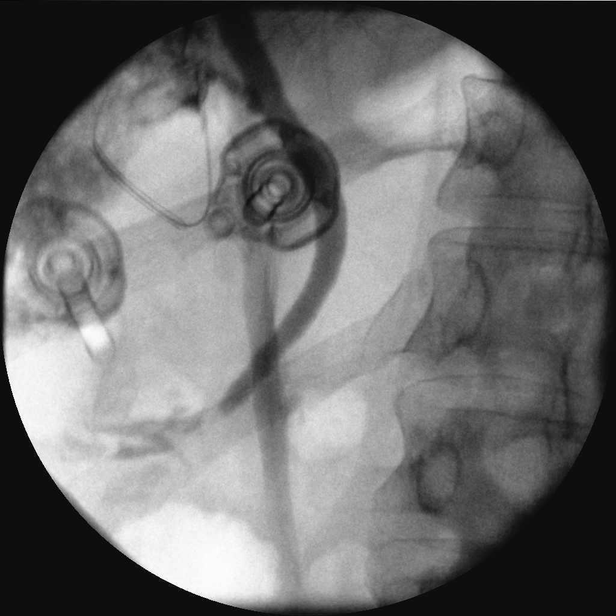

[Series 9: cont. · 2 of 45 frames shown (8 of 9)]
[frame 7/45]
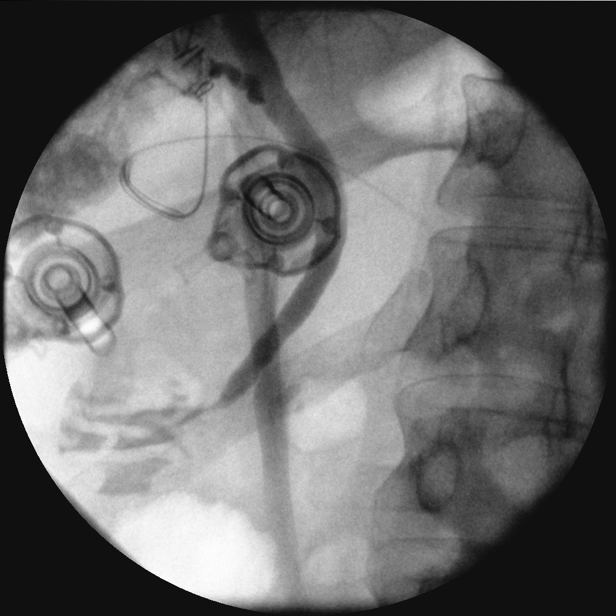
[frame 39/45]
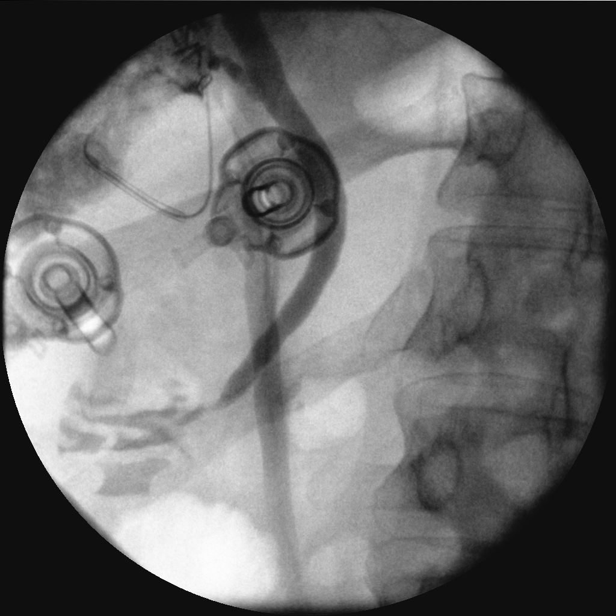

[Series 11: cont. · 2 of 74 frames shown (9 of 9)]
[frame 3/74]
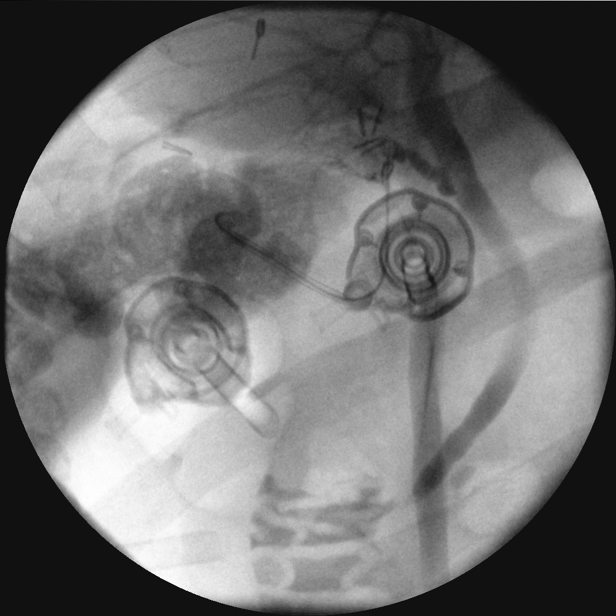
[frame 63/74]
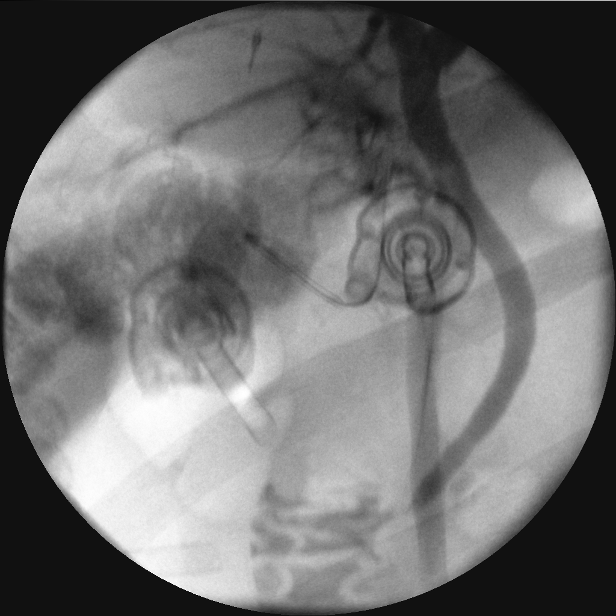

[14 of 24 positions shown; findings below may reference images not displayed]

FINDINGS: Surgical instruments project over the upper abdomen.

There is cannulation of the cystic duct/gallbladder neck, with
antegrade infusion of contrast. Caliber of the extrahepatic ductal
system within normal limits.

No large filling defect identified.

Free flow of contrast across the ampulla.
IMPRESSION: Intraoperative cholangiogram demonstrates extrahepatic biliary ducts
of unremarkable caliber, with no large filling defect identified.
Free flow of contrast across the ampulla.

Please refer to the dictated operative report for full details of
intraoperative findings and procedure

## 2018-06-25 ENCOUNTER — Other Ambulatory Visit: Payer: Self-pay | Admitting: Physician Assistant

## 2018-06-25 DIAGNOSIS — I10 Essential (primary) hypertension: Secondary | ICD-10-CM

## 2018-07-06 ENCOUNTER — Ambulatory Visit (INDEPENDENT_AMBULATORY_CARE_PROVIDER_SITE_OTHER): Payer: 59 | Admitting: Physician Assistant

## 2018-07-06 ENCOUNTER — Encounter: Payer: Self-pay | Admitting: Physician Assistant

## 2018-07-06 VITALS — BP 150/89 | HR 83 | Temp 98.9°F | Wt 193.0 lb

## 2018-07-06 DIAGNOSIS — Z1329 Encounter for screening for other suspected endocrine disorder: Secondary | ICD-10-CM | POA: Diagnosis not present

## 2018-07-06 DIAGNOSIS — E785 Hyperlipidemia, unspecified: Secondary | ICD-10-CM | POA: Diagnosis not present

## 2018-07-06 DIAGNOSIS — I10 Essential (primary) hypertension: Secondary | ICD-10-CM

## 2018-07-06 DIAGNOSIS — Z130 Encounter for screening for diseases of the blood and blood-forming organs and certain disorders involving the immune mechanism: Secondary | ICD-10-CM

## 2018-07-06 DIAGNOSIS — F32A Depression, unspecified: Secondary | ICD-10-CM

## 2018-07-06 DIAGNOSIS — Z0000 Encounter for general adult medical examination without abnormal findings: Secondary | ICD-10-CM | POA: Diagnosis not present

## 2018-07-06 DIAGNOSIS — F329 Major depressive disorder, single episode, unspecified: Secondary | ICD-10-CM | POA: Diagnosis not present

## 2018-07-06 DIAGNOSIS — Z1159 Encounter for screening for other viral diseases: Secondary | ICD-10-CM

## 2018-07-06 NOTE — Patient Instructions (Signed)

## 2018-07-06 NOTE — Progress Notes (Signed)
Patient: Roy Little, Male    DOB: 12-17-1965, 52 y.o.   MRN: [id number] Visit Date: 07/06/2018  Today's Provider: Trinna Post, PA-C   Chief Complaint  Patient presents with  . Annual Exam   Subjective:    Annual physical exam Roy Little is a 52 y.o. male who presents today for health maintenance and complete physical. He feels well. He reports exercising includes gym. He reports he is sleeping well.  Cologuard: 07/22/2017 negative  HTN: Currently taking 40 mg lisinopril and 10 mg amlodipine. Says he has been irritated at work and Bps are running normally at home.  BP Readings from Last 3 Encounters:  07/06/18 (!) 150/89  02/10/18 (!) 142/92  11/11/17 (!) 152/80   HLD: Currently taking lipitor 10 mg daily without issues.  Lipid Panel     Component Value Date/Time   CHOL 219 (H) 07/06/2018 1612   TRIG 341 (H) 07/06/2018 1612   HDL 45 07/06/2018 1612   CHOLHDL 4.9 07/06/2018 1612   LDLCALC 106 (H) 07/06/2018 1612   Depression: Reports he is stable on 100 mg zoloft daily.   -----------------------------------------------------------------     Review of Systems  Constitutional: Positive for fatigue.  HENT: Negative.   Eyes: Positive for redness.  Respiratory: Negative.   Cardiovascular: Negative.   Gastrointestinal: Negative.   Endocrine: Negative.   Genitourinary: Negative.   Musculoskeletal: Positive for arthralgias, back pain and neck pain.  Skin: Negative.   Allergic/Immunologic: Positive for environmental allergies.  Neurological: Negative.   Hematological: Negative.   Psychiatric/Behavioral: The patient is nervous/anxious.     Social History He  reports that he has never smoked. He has never used smokeless tobacco. He reports current alcohol use. He reports that he does not use drugs. Social History   Socioeconomic History  . Marital status: Married    Spouse name: Not on file  . Number of children: Not on file  . Years of  education: Not on file  . Highest education level: Not on file  Occupational History  . Not on file  Social Needs  . Financial resource strain: Not on file  . Food insecurity:    Worry: Not on file    Inability: Not on file  . Transportation needs:    Medical: Not on file    Non-medical: Not on file  Tobacco Use  . Smoking status: Never Smoker  . Smokeless tobacco: Never Used  Substance and Sexual Activity  . Alcohol use: Yes    Frequency: Never    Comment: Very Rarely  . Drug use: No  . Sexual activity: Yes  Lifestyle  . Physical activity:    Days per week: Not on file    Minutes per session: Not on file  . Stress: Not on file  Relationships  . Social connections:    Talks on phone: Not on file    Gets together: Not on file    Attends religious service: Not on file    Active member of club or organization: Not on file    Attends meetings of clubs or organizations: Not on file    Relationship status: Not on file  Other Topics Concern  . Not on file  Social History Narrative  . Not on file    Patient Active Problem List   Diagnosis Date Noted  . Hyperlipidemia 11/12/2017  . Depression 11/12/2017  . Fitting and adjustment of gastrointestinal appliance and device   . Common bile  duct leak   . Hypertension 08/01/2017  . Choledocholithiasis   . RUQ abdominal pain   . Jaundice   . Cholecystitis, acute with cholelithiasis 06/06/2017    Past Surgical History:  Procedure Laterality Date  . CHOLECYSTECTOMY N/A 06/06/2017   Procedure: LAPAROSCOPIC CHOLECYSTECTOMY WITH INTRAOPERATIVE CHOLANGIOGRAM;  Surgeon: Jules Husbands, MD;  Location: [facility] ORS;  Service: General;  Laterality: N/A;  . ERCP N/A 06/10/2017   Procedure: ENDOSCOPIC RETROGRADE CHOLANGIOPANCREATOGRAPHY (ERCP);  Surgeon: Lucilla Lame, MD;  Location: [facility] ENDOSCOPY;  Service: Endoscopy;  Laterality: N/A;  . ERCP N/A 09/02/2017   Procedure: ENDOSCOPIC RETROGRADE CHOLANGIOPANCREATOGRAPHY (ERCP);  Surgeon:  Lucilla Lame, MD;  Location: [facility] ENDOSCOPY;  Service: Endoscopy;  Laterality: N/A;  . KNEE SURGERY      Family History  Family Status  Relation Name Status  . Mother  Deceased  . Father  Deceased  . MGM  Deceased  . MGF  Deceased   His family history includes COPD in his maternal grandmother and mother; Congestive Heart Failure in his maternal grandfather; Hypertension in his mother; Pancreatic cancer in his father.     No Known Allergies  Previous Medications   AMLODIPINE (NORVASC) 10 MG TABLET    TAKE 1 TABLET(10 MG) BY MOUTH DAILY   ATORVASTATIN (LIPITOR) 10 MG TABLET    Take 1 tablet (10 mg total) by mouth daily.   CYCLOBENZAPRINE (FLEXERIL) 10 MG TABLET    Take 1 tablet (10 mg total) by mouth at bedtime.   FINASTERIDE (PROSCAR) 5 MG TABLET    Take 1 tablet by mouth daily.   LISINOPRIL (PRINIVIL,ZESTRIL) 40 MG TABLET    TAKE 1 TABLET(40 MG) BY MOUTH DAILY   METOPROLOL TARTRATE (LOPRESSOR) 25 MG TABLET    TAKE 1 TABLET(25 MG) BY MOUTH TWICE DAILY   PREDNISONE (DELTASONE) 10 MG TABLET    Take 6 pills on day 1, 5 pills on day 2, and so on until complete.   SERTRALINE (ZOLOFT) 100 MG TABLET    TAKE 1 TABLET(100 MG) BY MOUTH DAILY    Patient Care Team: Paulene Floor as PCP - General (Physician Assistant)      Objective:   Vitals: BP (!) 150/89 (BP Location: Left Arm, Patient Position: Sitting, Cuff Size: Normal)   Pulse 83   Temp 98.9 F (37.2 C) (Oral)   Wt 193 lb (87.5 kg)   SpO2 96%   BMI 28.50 kg/m    Physical Exam   Depression Screen PHQ 2/9 Scores 07/06/2018 06/30/2017  PHQ - 2 Score 0 2  PHQ- 9 Score 0 7      Assessment & Plan:     Routine Health Maintenance and Physical Exam  Exercise Activities and Dietary recommendations Goals   None     Immunization History  Administered Date(s) Administered  . Tdap 06/30/2017    Health Maintenance  Topic Date Due  . Hepatitis C Screening  [birth year]  . INFLUENZA VACCINE  02/12/2018  .  Fecal DNA (Cologuard)  07/22/2020  . TETANUS/TDAP  07/01/2027  . HIV Screening  Completed     Discussed health benefits of physical activity, and encouraged him to engage in regular exercise appropriate for his age and condition.    1. Annual physical exam  COloguard UTD, no family hx prostate cancer.  2. Encounter for hepatitis C screening test for low risk patient  - Hepatitis C antibody  3. Hypertension, unspecified type  Recheck next visit, continue current medications.  - Comprehensive metabolic panel  4. Hyperlipidemia, unspecified hyperlipidemia type  Increase lipitor to 20 mg.  - Lipid panel  5. Thyroid disorder screening  - TSH  6. Screening for deficiency anemia  - CBC with Differential/Platelet  7. Depression, unspecified depression type  Continue zoloft 100 mg QD.  Return in about 6 months (around 01/05/2019) for HTN, HLD, Depression .  The entirety of the information documented in the History of Present Illness, Review of Systems and Physical Exam were personally obtained by me. Portions of this information were initially documented by Hurman Horn, CMa and reviewed by me for thoroughness and accuracy.       --------------------------------------------------------------------

## 2018-07-07 LAB — CBC WITH DIFFERENTIAL/PLATELET
Basophils Absolute: 0.1 10*3/uL (ref 0.0–0.2)
Basos: 1 %
EOS (ABSOLUTE): 0.4 10*3/uL (ref 0.0–0.4)
Eos: 6 %
Hematocrit: 41.7 % (ref 37.5–51.0)
Hemoglobin: 14.2 g/dL (ref 13.0–17.7)
Immature Grans (Abs): 0 10*3/uL (ref 0.0–0.1)
Immature Granulocytes: 0 %
Lymphocytes Absolute: 1.2 10*3/uL (ref 0.7–3.1)
Lymphs: 17 %
MCH: 29.6 pg (ref 26.6–33.0)
MCHC: 34.1 g/dL (ref 31.5–35.7)
MCV: 87 fL (ref 79–97)
Monocytes Absolute: 0.8 10*3/uL (ref 0.1–0.9)
Monocytes: 11 %
Neutrophils Absolute: 4.6 10*3/uL (ref 1.4–7.0)
Neutrophils: 65 %
Platelets: 298 10*3/uL (ref 150–450)
RBC: 4.8 x10E6/uL (ref 4.14–5.80)
RDW: 12.6 % (ref 12.3–15.4)
WBC: 7.1 10*3/uL (ref 3.4–10.8)

## 2018-07-07 LAB — COMPREHENSIVE METABOLIC PANEL
ALT: 23 IU/L (ref 0–44)
AST: 22 IU/L (ref 0–40)
Albumin/Globulin Ratio: 2.4 — ABNORMAL HIGH (ref 1.2–2.2)
Albumin: 4.5 g/dL (ref 3.5–5.5)
Alkaline Phosphatase: 104 IU/L (ref 39–117)
BUN/Creatinine Ratio: 20 (ref 9–20)
BUN: 18 mg/dL (ref 6–24)
Bilirubin Total: 0.3 mg/dL (ref 0.0–1.2)
CO2: 23 mmol/L (ref 20–29)
Calcium: 9 mg/dL (ref 8.7–10.2)
Chloride: 103 mmol/L (ref 96–106)
Creatinine, Ser: 0.88 mg/dL (ref 0.76–1.27)
GFR calc Af Amer: 113 mL/min/{1.73_m2} (ref >59)
GFR calc non Af Amer: 97 mL/min/{1.73_m2} (ref >59)
Globulin, Total: 1.9 g/dL (ref 1.5–4.5)
Glucose: 89 mg/dL (ref 65–99)
Potassium: 4.1 mmol/L (ref 3.5–5.2)
Sodium: 139 mmol/L (ref 134–144)
Total Protein: 6.4 g/dL (ref 6.0–8.5)

## 2018-07-07 LAB — LIPID PANEL
Chol/HDL Ratio: 4.9 ratio (ref 0.0–5.0)
Cholesterol, Total: 219 mg/dL — ABNORMAL HIGH (ref 100–199)
HDL: 45 mg/dL (ref >39)
LDL Calculated: 106 mg/dL — ABNORMAL HIGH (ref 0–99)
Triglycerides: 341 mg/dL — ABNORMAL HIGH (ref 0–149)
VLDL Cholesterol Cal: 68 mg/dL — ABNORMAL HIGH (ref 5–40)

## 2018-07-07 LAB — HEPATITIS C ANTIBODY: Hep C Virus Ab: <0.1 s/co ratio (ref 0.0–0.9)

## 2018-07-07 LAB — TSH: TSH: 3.04 u[IU]/mL (ref 0.450–4.500)

## 2018-07-09 ENCOUNTER — Telehealth: Payer: Self-pay

## 2018-07-09 MED ORDER — ATORVASTATIN CALCIUM 20 MG PO TABS: 20.0000 mg | ORAL_TABLET | Freq: Every day | ORAL | 3 refills | Status: DC

## 2018-07-09 NOTE — Telephone Encounter (Signed)
-----   Message from Trinna Post, [place] sent at 07/07/2018  9:58 AM EST ----- Labs normal except for cholesterol which is just slightly elevated. Recommend taking Lipitor 20 mg daily, can take two ten mg tablets at night and pick up new rx. Please send Lipitor 20 mg QHS #90 to pharmacy.

## 2018-07-09 NOTE — Telephone Encounter (Signed)
LVMTRC 

## 2018-07-09 NOTE — Telephone Encounter (Signed)
Patient was advised and medication send to the pharmacy.

## 2018-07-25 ENCOUNTER — Other Ambulatory Visit: Payer: Self-pay | Admitting: Physician Assistant

## 2018-07-25 DIAGNOSIS — I10 Essential (primary) hypertension: Secondary | ICD-10-CM

## 2018-07-25 DIAGNOSIS — E785 Hyperlipidemia, unspecified: Secondary | ICD-10-CM

## 2018-08-21 ENCOUNTER — Other Ambulatory Visit: Payer: Self-pay | Admitting: Physician Assistant

## 2018-08-21 DIAGNOSIS — I10 Essential (primary) hypertension: Secondary | ICD-10-CM

## 2018-08-24 ENCOUNTER — Other Ambulatory Visit: Payer: Self-pay | Admitting: Physician Assistant

## 2018-08-24 DIAGNOSIS — I10 Essential (primary) hypertension: Secondary | ICD-10-CM

## 2018-09-23 ENCOUNTER — Other Ambulatory Visit: Payer: Self-pay | Admitting: Family Medicine

## 2018-09-23 ENCOUNTER — Other Ambulatory Visit: Payer: Self-pay | Admitting: Physician Assistant

## 2018-09-23 DIAGNOSIS — I10 Essential (primary) hypertension: Secondary | ICD-10-CM

## 2018-09-23 DIAGNOSIS — F32A Depression, unspecified: Secondary | ICD-10-CM

## 2018-09-23 DIAGNOSIS — F419 Anxiety disorder, unspecified: Principal | ICD-10-CM

## 2018-09-23 DIAGNOSIS — F329 Major depressive disorder, single episode, unspecified: Secondary | ICD-10-CM

## 2018-11-22 ENCOUNTER — Other Ambulatory Visit: Payer: Self-pay | Admitting: Physician Assistant

## 2018-11-22 DIAGNOSIS — I10 Essential (primary) hypertension: Secondary | ICD-10-CM

## 2019-01-05 ENCOUNTER — Encounter: Payer: Self-pay | Admitting: Physician Assistant

## 2019-01-05 ENCOUNTER — Other Ambulatory Visit: Payer: Self-pay

## 2019-01-05 ENCOUNTER — Ambulatory Visit: Payer: 59 | Admitting: Physician Assistant

## 2019-01-05 VITALS — BP 146/87 | HR 72 | Temp 98.6°F | Resp 16 | Ht 69.0 in | Wt 194.8 lb

## 2019-01-05 DIAGNOSIS — F329 Major depressive disorder, single episode, unspecified: Secondary | ICD-10-CM

## 2019-01-05 DIAGNOSIS — I10 Essential (primary) hypertension: Secondary | ICD-10-CM | POA: Diagnosis not present

## 2019-01-05 DIAGNOSIS — E785 Hyperlipidemia, unspecified: Secondary | ICD-10-CM | POA: Diagnosis not present

## 2019-01-05 DIAGNOSIS — F32A Depression, unspecified: Secondary | ICD-10-CM

## 2019-01-05 NOTE — Progress Notes (Signed)
Patient: Roy Little Male    DOB: Nov 08, 1965   53 y.o.   MRN: [id number] Visit Date: 01/06/2019  Today's Provider: Trinna Post, PA-C   Chief Complaint  Patient presents with  . Follow-up   Subjective:     HPI  Follow up for hypertension  Still working full time in person.   The patient was last seen for this 6 months ago. Changes made at last visit include no changes.  He reports excellent compliance with treatment. He feels that condition is Improved. He is not having side effects.   BP Readings from Last 3 Encounters:  01/05/19 (!) 146/87  07/06/18 (!) 150/89  02/10/18 (!) 142/92    BP 127/77 at home  ------------------------------------------------------------------------------------  Follow up for hyperlipidemia  The patient was last seen for this 6 months ago. Changes made at last visit include increasing lipitor to 20 mg.  He reports excellent compliance with treatment. He feels that condition is Improved. He is not having side effects.   ------------------------------------------------------------------------------------   Follow up for depression  The patient was last seen for this 6 months ago. Changes made at last visit include no changes.  He reports excellent compliance with treatment. He feels that condition is Improved. He is not having side effects.   Continues zoloft 100 mg daily. Had a brief period of time where he worked less due to [place] and felt relief of stress from this. Has since resumed full time work. Does not desire increase of medication.  ------------------------------------------------------------------------------------    No Known Allergies   Current Outpatient Medications:  .  amLODipine (NORVASC) 10 MG tablet, TAKE 1 TABLET(10 MG) BY MOUTH DAILY, Disp: 90 tablet, Rfl: 1 .  atorvastatin (LIPITOR) 20 MG tablet, Take 1 tablet (20 mg total) by mouth at bedtime., Disp: 90 tablet, Rfl: 3 .  finasteride  (PROSCAR) 5 MG tablet, Take 1 tablet by mouth daily., Disp: , Rfl:  .  lisinopril (PRINIVIL,ZESTRIL) 40 MG tablet, TAKE 1 TABLET(40 MG) BY MOUTH DAILY, Disp: 90 tablet, Rfl: 1 .  metoprolol tartrate (LOPRESSOR) 25 MG tablet, TAKE 1 TABLET(25 MG) BY MOUTH TWICE DAILY, Disp: 180 tablet, Rfl: 0 .  sertraline (ZOLOFT) 100 MG tablet, TAKE 1 TABLET(100 MG) BY MOUTH DAILY, Disp: 90 tablet, Rfl: 1  Review of Systems  Constitutional: Negative.   Respiratory: Negative.   Cardiovascular: Negative.   Psychiatric/Behavioral: The patient is nervous/anxious.     Social History   Tobacco Use  . Smoking status: Never Smoker  . Smokeless tobacco: Never Used  Substance Use Topics  . Alcohol use: Yes    Frequency: Never    Comment: Very Rarely      Objective:   BP (!) 146/87 (BP Location: Left Arm, Patient Position: Sitting, Cuff Size: Large)   Pulse 72   Temp 98.6 F (37 C) (Oral)   Resp 16   Ht 5\' 9"  (1.753 m)   Wt 194 lb 12.8 oz (88.4 kg)   SpO2 96%   BMI 28.77 kg/m  Vitals:   01/05/19 1537  BP: (!) 146/87  Pulse: 72  Resp: 16  Temp: 98.6 F (37 C)  TempSrc: Oral  SpO2: 96%  Weight: 194 lb 12.8 oz (88.4 kg)  Height: 5\' 9"  (1.753 m)     Physical Exam Constitutional:      Appearance: Normal appearance.  Cardiovascular:     Rate and Rhythm: Normal rate and regular rhythm.  Pulmonary:     Breath  sounds: Normal breath sounds.  Skin:    General: Skin is warm and dry.  Neurological:     Mental Status: He is alert and oriented to person, place, and time. Mental status is at baseline.  Psychiatric:        Mood and Affect: Mood normal.        Behavior: Behavior normal.      Results for orders placed or performed in visit on 01/05/19  Lipid Profile  Result Value Ref Range   Cholesterol, Total 193 100 - 199 mg/dL   Triglycerides 209 (H) 0 - 149 mg/dL   HDL 46 >39 mg/dL   VLDL Cholesterol Cal 42 (H) 5 - 40 mg/dL   LDL Calculated 105 (H) 0 - 99 mg/dL   Chol/HDL Ratio 4.2  0.0 - 5.0 ratio       Assessment & Plan    1. Hyperlipidemia, unspecified hyperlipidemia type  Cholesterol slightly improved. Currently taking 20 mg QHS. I will give him the option of increasing to lipitor 40 mg QHS or staying at 20 mg and continuing to monitor. F/u 6 months for CPE.  - Lipid Profile  2. Depression, unspecified depression type  Continue zoloft 100 mg daily.  3. Hypertension, unspecified type  Slightly high in office but well controlled at home. Continue amlodipine 10 mg daily, lisinopril 40 mg daily and lopressor 25 mg BID.   The entirety of the information documented in the History of Present Illness, Review of Systems and Physical Exam were personally obtained by me. Portions of this information were initially documented by Lynford Humphrey, CMA and reviewed by me for thoroughness and accuracy.       Trinna Post, PA-C  [facility]

## 2019-01-05 NOTE — Patient Instructions (Signed)

## 2019-01-06 ENCOUNTER — Telehealth: Payer: Self-pay

## 2019-01-06 LAB — LIPID PANEL
Chol/HDL Ratio: 4.2 ratio (ref 0.0–5.0)
Cholesterol, Total: 193 mg/dL (ref 100–199)
HDL: 46 mg/dL (ref >39)
LDL Calculated: 105 mg/dL — ABNORMAL HIGH (ref 0–99)
Triglycerides: 209 mg/dL — ABNORMAL HIGH (ref 0–149)
VLDL Cholesterol Cal: 42 mg/dL — ABNORMAL HIGH (ref 5–40)

## 2019-01-06 NOTE — Telephone Encounter (Signed)
-----   Message from Trinna Post, [place] sent at 01/06/2019  8:57 AM EDT ----- Cholesterol a little lower. Can increase lipitor or keep at same dose and monitor.

## 2019-01-06 NOTE — Telephone Encounter (Signed)
[email or web address]

## 2019-01-07 MED ORDER — ATORVASTATIN CALCIUM 40 MG PO TABS: 40.0000 mg | ORAL_TABLET | Freq: Every day | ORAL | 1 refills | Status: DC

## 2019-01-07 NOTE — Telephone Encounter (Signed)
[email or web address]

## 2019-01-07 NOTE — Telephone Encounter (Signed)
Patient advised as below. Patient willing to try a higher dose.

## 2019-01-28 ENCOUNTER — Other Ambulatory Visit: Payer: Self-pay | Admitting: Physician Assistant

## 2019-01-28 DIAGNOSIS — F32A Depression, unspecified: Secondary | ICD-10-CM

## 2019-01-28 DIAGNOSIS — F329 Major depressive disorder, single episode, unspecified: Secondary | ICD-10-CM

## 2019-02-20 ENCOUNTER — Other Ambulatory Visit: Payer: Self-pay | Admitting: Physician Assistant

## 2019-02-20 DIAGNOSIS — I10 Essential (primary) hypertension: Secondary | ICD-10-CM

## 2019-03-02 ENCOUNTER — Other Ambulatory Visit: Payer: Self-pay | Admitting: Physician Assistant

## 2019-03-02 DIAGNOSIS — I10 Essential (primary) hypertension: Secondary | ICD-10-CM

## 2019-03-14 ENCOUNTER — Other Ambulatory Visit: Payer: Self-pay | Admitting: Physician Assistant

## 2019-03-14 DIAGNOSIS — I10 Essential (primary) hypertension: Secondary | ICD-10-CM

## 2019-05-25 ENCOUNTER — Other Ambulatory Visit: Payer: Self-pay | Admitting: Physician Assistant

## 2019-05-25 DIAGNOSIS — I10 Essential (primary) hypertension: Secondary | ICD-10-CM

## 2019-05-25 DIAGNOSIS — F32A Depression, unspecified: Secondary | ICD-10-CM

## 2019-05-25 DIAGNOSIS — F329 Major depressive disorder, single episode, unspecified: Secondary | ICD-10-CM

## 2019-05-26 NOTE — Telephone Encounter (Signed)
L.O.V. was on 01/05/2019 and upcoming visit on 07/06/2019.

## 2019-07-06 ENCOUNTER — Encounter: Payer: 59 | Admitting: Physician Assistant

## 2019-07-12 ENCOUNTER — Other Ambulatory Visit: Payer: Self-pay | Admitting: Physician Assistant

## 2019-07-21 ENCOUNTER — Ambulatory Visit (INDEPENDENT_AMBULATORY_CARE_PROVIDER_SITE_OTHER): Payer: 59 | Admitting: Physician Assistant

## 2019-07-21 ENCOUNTER — Other Ambulatory Visit: Payer: Self-pay

## 2019-07-21 DIAGNOSIS — F329 Major depressive disorder, single episode, unspecified: Secondary | ICD-10-CM

## 2019-07-21 DIAGNOSIS — R350 Frequency of micturition: Secondary | ICD-10-CM

## 2019-07-21 DIAGNOSIS — E785 Hyperlipidemia, unspecified: Secondary | ICD-10-CM | POA: Diagnosis not present

## 2019-07-21 DIAGNOSIS — Z125 Encounter for screening for malignant neoplasm of prostate: Secondary | ICD-10-CM

## 2019-07-21 DIAGNOSIS — I10 Essential (primary) hypertension: Secondary | ICD-10-CM

## 2019-07-21 DIAGNOSIS — L659 Nonscarring hair loss, unspecified: Secondary | ICD-10-CM

## 2019-07-21 DIAGNOSIS — Z0000 Encounter for general adult medical examination without abnormal findings: Secondary | ICD-10-CM

## 2019-07-21 DIAGNOSIS — F32A Depression, unspecified: Secondary | ICD-10-CM

## 2019-07-21 MED ORDER — FINASTERIDE 5 MG PO TABS: 5.0000 mg | ORAL_TABLET | Freq: Every day | ORAL | 3 refills | Status: AC

## 2019-07-21 NOTE — Patient Instructions (Signed)
Health Maintenance After Age 56 After age 56, you are at a higher risk for certain long-term diseases and infections as well as injuries from falls. Falls are a major cause of broken bones and head injuries in people who are older than age 56. Getting regular preventive care can help to keep you healthy and well. Preventive care includes getting regular testing and making lifestyle changes as recommended by your health care provider. Talk with your health care provider about:  Which screenings and tests you should have. A screening is a test that checks for a disease when you have no symptoms.  A diet and exercise plan that is right for you. What should I know about screenings and tests to prevent falls? Screening and testing are the best ways to find a health problem early. Early diagnosis and treatment give you the best chance of managing medical conditions that are common after age 56. Certain conditions and lifestyle choices may make you more likely to have a fall. Your health care provider may recommend:  Regular vision checks. Poor vision and conditions such as cataracts can make you more likely to have a fall. If you wear glasses, make sure to get your prescription updated if your vision changes.  Medicine review. Work with your health care provider to regularly review all of the medicines you are taking, including over-the-counter medicines. Ask your health care provider about any side effects that may make you more likely to have a fall. Tell your health care provider if any medicines that you take make you feel dizzy or sleepy.  Osteoporosis screening. Osteoporosis is a condition that causes the bones to get weaker. This can make the bones weak and cause them to break more easily.  Blood pressure screening. Blood pressure changes and medicines to control blood pressure can make you feel dizzy.  Strength and balance checks. Your health care provider may recommend certain tests to check your  strength and balance while standing, walking, or changing positions.  Foot health exam. Foot pain and numbness, as well as not wearing proper footwear, can make you more likely to have a fall.  Depression screening. You may be more likely to have a fall if you have a fear of falling, feel emotionally low, or feel unable to do activities that you used to do.  Alcohol use screening. Using too much alcohol can affect your balance and may make you more likely to have a fall. What actions can I take to lower my risk of falls? General instructions  Talk with your health care provider about your risks for falling. Tell your health care provider if: ? You fall. Be sure to tell your health care provider about all falls, even ones that seem minor. ? You feel dizzy, sleepy, or off-balance.  Take over-the-counter and prescription medicines only as told by your health care provider. These include any supplements.  Eat a healthy diet and maintain a healthy weight. A healthy diet includes low-fat dairy products, low-fat (lean) meats, and fiber from whole grains, beans, and lots of fruits and vegetables. Home safety  Remove any tripping hazards, such as rugs, cords, and clutter.  Install safety equipment such as grab bars in bathrooms and safety rails on stairs.  Keep rooms and walkways well-lit. Activity   Follow a regular exercise program to stay fit. This will help you maintain your balance. Ask your health care provider what types of exercise are appropriate for you.  If you need a cane or   walker, use it as recommended by your health care provider.  Wear supportive shoes that have nonskid soles. Lifestyle  Do not drink alcohol if your health care provider tells you not to drink.  If you drink alcohol, limit how much you have: ? 0-1 drink a day for women. ? 0-2 drinks a day for men.  Be aware of how much alcohol is in your drink. In the [place] one drink equals one typical bottle of beer (12  oz), one-half glass of wine (5 oz), or one shot of hard liquor (1 oz).  Do not use any products that contain nicotine or tobacco, such as cigarettes and e-cigarettes. If you need help quitting, ask your health care provider. Summary  Having a healthy lifestyle and getting preventive care can help to protect your health and wellness after age 56.  Screening and testing are the best way to find a health problem early and help you avoid having a fall. Early diagnosis and treatment give you the best chance for managing medical conditions that are more common for people who are older than age 56.  Falls are a major cause of broken bones and head injuries in people who are older than age 56. Take precautions to prevent a fall at home.  Work with your health care provider to learn what changes you can make to improve your health and wellness and to prevent falls. This information is not intended to replace advice given to you by your health care provider. Make sure you discuss any questions you have with your health care provider. Document Revised: 10/22/2018 Document Reviewed: 05/14/2017 Elsevier Patient Education  2020 Elsevier Inc.  

## 2019-07-21 NOTE — Progress Notes (Signed)
Patient: Roy Little, Male    DOB: 05-09-1966, 54 y.o.   MRN: [id number] Visit Date: 07/21/2019  Today's Provider: Trinna Post, PA-C   Chief Complaint  Patient presents with  . Annual Exam   Subjective:  I, Porsha McClurkin,CMA am acting as a Education administrator for [employer].   Annual physical exam Roy Little is a 54 y.o. male who presents today for health maintenance and complete physical. He feels well. He reports exercising includes weight training. He reports he is sleeping well.  Last cologuard:07/29/2017  HTN: Checked at home 130's/85  BP Readings from Last 3 Encounters:  01/05/19 (!) 146/87  07/06/18 (!) 150/89  02/10/18 (!) 142/92   HLD:  Lipid Panel     Component Value Date/Time   CHOL 180 07/21/2019 1544   TRIG 223 (H) 07/21/2019 1544   HDL 43 07/21/2019 1544   CHOLHDL 4.2 07/21/2019 1544   LDLCALC 99 07/21/2019 1544   LABVLDL 38 07/21/2019 1544     Depression: doing well with zoloft.   -----------------------------------------------------------------   Review of Systems  Constitutional: Negative.   HENT: Negative.   Eyes: Negative.   Respiratory: Negative.   Cardiovascular: Negative.   Gastrointestinal: Negative.   Endocrine: Negative.   Genitourinary: Negative.   Musculoskeletal: Positive for arthralgias.  Skin: Negative.   Allergic/Immunologic: Negative.   Neurological: Negative.   Hematological: Negative.   Psychiatric/Behavioral: Negative.     Social History He  reports that he has never smoked. He has never used smokeless tobacco. He reports current alcohol use. He reports that he does not use drugs. Social History   Socioeconomic History  . Marital status: Married    Spouse name: Not on file  . Number of children: Not on file  . Years of education: Not on file  . Highest education level: Not on file  Occupational History  . Not on file  Tobacco Use  . Smoking status: Never Smoker  . Smokeless tobacco: Never  Used  Substance and Sexual Activity  . Alcohol use: Yes    Comment: Very Rarely  . Drug use: No  . Sexual activity: Yes  Other Topics Concern  . Not on file  Social History Narrative  . Not on file   Social Determinants of Health   Financial Resource Strain:   . Difficulty of Paying Living Expenses: Not on file  Food Insecurity:   . Worried About Charity fundraiser in the Last Year: Not on file  . Ran Out of Food in the Last Year: Not on file  Transportation Needs:   . Lack of Transportation (Medical): Not on file  . Lack of Transportation (Non-Medical): Not on file  Physical Activity:   . Days of Exercise per Week: Not on file  . Minutes of Exercise per Session: Not on file  Stress:   . Feeling of Stress : Not on file  Social Connections:   . Frequency of Communication with Friends and Family: Not on file  . Frequency of Social Gatherings with Friends and Family: Not on file  . Attends Religious Services: Not on file  . Active Member of Clubs or Organizations: Not on file  . Attends Archivist Meetings: Not on file  . Marital Status: Not on file    Patient Active Problem List   Diagnosis Date Noted  . Hyperlipidemia 11/12/2017  . Depression 11/12/2017  . Fitting and adjustment of gastrointestinal appliance and device   .  Common bile duct leak   . Hypertension 08/01/2017  . Choledocholithiasis   . RUQ abdominal pain   . Jaundice   . Cholecystitis, acute with cholelithiasis 06/06/2017    Past Surgical History:  Procedure Laterality Date  . CHOLECYSTECTOMY N/A 06/06/2017   Procedure: LAPAROSCOPIC CHOLECYSTECTOMY WITH INTRAOPERATIVE CHOLANGIOGRAM;  Surgeon: Jules Husbands, MD;  Location: [facility] ORS;  Service: General;  Laterality: N/A;  . ERCP N/A 06/10/2017   Procedure: ENDOSCOPIC RETROGRADE CHOLANGIOPANCREATOGRAPHY (ERCP);  Surgeon: Lucilla Lame, MD;  Location[facility] ENDOSCOPY;  Service: Endoscopy;  Laterality: N/A;  . ERCP N/A 09/02/2017   Procedure:  ENDOSCOPIC RETROGRADE CHOLANGIOPANCREATOGRAPHY (ERCP);  Surgeon: Lucilla Lame, MD;  Location: [facility] ENDOSCOPY;  Service: Endoscopy;  Laterality: N/A;  . KNEE SURGERY      Family History  Family Status  Relation Name Status  . Mother  Deceased  . Father  Deceased  . MGM  Deceased  . MGF  Deceased   His family history includes COPD in his maternal grandmother and mother; Congestive Heart Failure in his maternal grandfather; Hypertension in his mother; Pancreatic cancer in his father.     No Known Allergies  Previous Medications   AMLODIPINE (NORVASC) 10 MG TABLET    TAKE 1 TABLET(10 MG) BY MOUTH DAILY   ATORVASTATIN (LIPITOR) 40 MG TABLET    TAKE 1 TABLET(40 MG) BY MOUTH AT BEDTIME   FINASTERIDE (PROSCAR) 5 MG TABLET    Take 1 tablet by mouth daily.   LISINOPRIL (ZESTRIL) 40 MG TABLET    TAKE 1 TABLET(40 MG) BY MOUTH DAILY   METOPROLOL TARTRATE (LOPRESSOR) 25 MG TABLET    TAKE 1 TABLET(25 MG) BY MOUTH TWICE DAILY   SERTRALINE (ZOLOFT) 100 MG TABLET    TAKE 1 TABLET(100 MG) BY MOUTH DAILY    Patient Care Team: Paulene Floor as PCP - General (Physician Assistant)      Objective:   Vitals: There were no vitals taken for this visit.   Physical Exam Constitutional:      Appearance: Normal appearance.  Cardiovascular:     Rate and Rhythm: Normal rate and regular rhythm.     Heart sounds: Normal heart sounds.  Pulmonary:     Effort: Pulmonary effort is normal.     Breath sounds: Normal breath sounds.  Abdominal:     General: Bowel sounds are normal.     Palpations: Abdomen is soft.  Skin:    General: Skin is warm and dry.  Neurological:     Mental Status: He is alert and oriented to person, place, and time. Mental status is at baseline.  Psychiatric:        Mood and Affect: Mood normal.        Behavior: Behavior normal.      Depression Screen PHQ 2/9 Scores 07/21/2019 07/06/2018 06/30/2017  PHQ - 2 Score 0 0 2  PHQ- 9 Score 0 0 7      Assessment & Plan:       Routine Health Maintenance and Physical Exam  Exercise Activities and Dietary recommendations Goals   None     Immunization History  Administered Date(s) Administered  . Tdap 06/30/2017    Health Maintenance  Topic Date Due  . INFLUENZA VACCINE  02/13/2019  . Fecal DNA (Cologuard)  07/22/2020  . TETANUS/TDAP  07/01/2027  . Hepatitis C Screening  Completed  . HIV Screening  Completed     Discussed health benefits of physical activity, and encouraged him to engage in regular  exercise appropriate for his age and condition.    Annual physical exam - Plan: TSH, Lipid panel, Comprehensive metabolic panel, CBC with Differential/Platelet  Prostate cancer screening - Plan: PSA  Hyperlipidemia, unspecified hyperlipidemia type  Hypertension, unspecified type  Depression, unspecified depression type  Urinary frequency - Plan: finasteride (PROSCAR) 5 MG tablet  Hair loss - Plan: finasteride (PROSCAR) 5 MG tablet ,  --------------------------------------------------------------------

## 2019-07-22 LAB — CBC WITH DIFFERENTIAL/PLATELET
Basophils Absolute: 0 10*3/uL (ref 0.0–0.2)
Basos: 1 %
EOS (ABSOLUTE): 0.3 10*3/uL (ref 0.0–0.4)
Eos: 5 %
Hematocrit: 42.1 % (ref 37.5–51.0)
Hemoglobin: 14.6 g/dL (ref 13.0–17.7)
Immature Grans (Abs): 0 10*3/uL (ref 0.0–0.1)
Immature Granulocytes: 0 %
Lymphocytes Absolute: 1 10*3/uL (ref 0.7–3.1)
Lymphs: 15 %
MCH: 30.9 pg (ref 26.6–33.0)
MCHC: 34.7 g/dL (ref 31.5–35.7)
MCV: 89 fL (ref 79–97)
Monocytes Absolute: 0.6 10*3/uL (ref 0.1–0.9)
Monocytes: 9 %
Neutrophils Absolute: 4.8 10*3/uL (ref 1.4–7.0)
Neutrophils: 70 %
Platelets: 326 10*3/uL (ref 150–450)
RBC: 4.72 x10E6/uL (ref 4.14–5.80)
RDW: 12.3 % (ref 11.6–15.4)
WBC: 6.8 10*3/uL (ref 3.4–10.8)

## 2019-07-22 LAB — COMPREHENSIVE METABOLIC PANEL
ALT: 17 IU/L (ref 0–44)
AST: 19 IU/L (ref 0–40)
Albumin/Globulin Ratio: 1.9 (ref 1.2–2.2)
Albumin: 4.4 g/dL (ref 3.8–4.9)
Alkaline Phosphatase: 128 IU/L — ABNORMAL HIGH (ref 39–117)
BUN/Creatinine Ratio: 26 — ABNORMAL HIGH (ref 9–20)
BUN: 23 mg/dL (ref 6–24)
Bilirubin Total: <0.2 mg/dL (ref 0.0–1.2)
CO2: 24 mmol/L (ref 20–29)
Calcium: 8.8 mg/dL (ref 8.7–10.2)
Chloride: 103 mmol/L (ref 96–106)
Creatinine, Ser: 0.88 mg/dL (ref 0.76–1.27)
GFR calc Af Amer: 112 mL/min/{1.73_m2} (ref >59)
GFR calc non Af Amer: 97 mL/min/{1.73_m2} (ref >59)
Globulin, Total: 2.3 g/dL (ref 1.5–4.5)
Glucose: 97 mg/dL (ref 65–99)
Potassium: 4.2 mmol/L (ref 3.5–5.2)
Sodium: 139 mmol/L (ref 134–144)
Total Protein: 6.7 g/dL (ref 6.0–8.5)

## 2019-07-22 LAB — LIPID PANEL
Chol/HDL Ratio: 4.2 ratio (ref 0.0–5.0)
Cholesterol, Total: 180 mg/dL (ref 100–199)
HDL: 43 mg/dL (ref >39)
LDL Chol Calc (NIH): 99 mg/dL (ref 0–99)
Triglycerides: 223 mg/dL — ABNORMAL HIGH (ref 0–149)
VLDL Cholesterol Cal: 38 mg/dL (ref 5–40)

## 2019-07-22 LAB — TSH: TSH: 1.72 u[IU]/mL (ref 0.450–4.500)

## 2019-07-22 LAB — PSA: Prostate Specific Ag, Serum: 0.4 ng/mL (ref 0.0–4.0)

## 2019-08-23 ENCOUNTER — Other Ambulatory Visit: Payer: Self-pay | Admitting: Physician Assistant

## 2019-08-23 DIAGNOSIS — I10 Essential (primary) hypertension: Secondary | ICD-10-CM

## 2019-08-23 NOTE — Telephone Encounter (Signed)
Requested Prescriptions  Pending Prescriptions Disp Refills  . metoprolol tartrate (LOPRESSOR) 25 MG tablet [Pharmacy Med Name: METOPROLOL TARTRATE 25MG  TABLETS] 180 tablet 0    Sig: TAKE 1 TABLET(25 MG) BY MOUTH TWICE DAILY     Cardiovascular:  Beta Blockers Failed - 08/23/2019  7:15 AM      Failed - Last BP in normal range    BP Readings from Last 1 Encounters:  01/05/19 (!) 146/87         Failed - Valid encounter within last 6 months    Recent Outpatient Visits          1 month ago Annual physical exam   [place], [place], PA-C   7 months ago Hyperlipidemia, unspecified hyperlipidemia type   [facility] [place], Wendee Beavers, [place]   1 year ago Annual physical exam   [facility] Carles Collet M, [place]   1 year ago Acute bilateral low back pain without sciatica   [facility] Trinna Post, [place]   1 year ago Essential hypertension   [facility] Trinna Post, [place]      Future Appointments            In 4 months Trinna Post, PA-C [employer]           Passed - Last Heart Rate in normal range    Pulse Readings from Last 1 Encounters:  01/05/19 72

## 2019-09-06 ENCOUNTER — Other Ambulatory Visit: Payer: Self-pay | Admitting: Physician Assistant

## 2019-09-06 DIAGNOSIS — I10 Essential (primary) hypertension: Secondary | ICD-10-CM

## 2019-10-31 ENCOUNTER — Other Ambulatory Visit: Payer: Self-pay | Admitting: Physician Assistant

## 2019-11-01 NOTE — Telephone Encounter (Signed)
Requested Prescriptions  Pending Prescriptions Disp Refills  . atorvastatin (LIPITOR) 40 MG tablet [Pharmacy Med Name: ATORVASTATIN 40MG  TABLETS] 90 tablet 1    Sig: TAKE 1 TABLET(40 MG) BY MOUTH AT BEDTIME     Cardiovascular:  Antilipid - Statins Failed - 10/31/2019  7:08 PM      Failed - LDL in normal range and within 360 days    LDL Chol Calc (NIH)  Date Value Ref Range Status  07/21/2019 99 0 - 99 mg/dL Final         Failed - Triglycerides in normal range and within 360 days    Triglycerides  Date Value Ref Range Status  07/21/2019 223 (H) 0 - 149 mg/dL Final         Passed - Total Cholesterol in normal range and within 360 days    Cholesterol, Total  Date Value Ref Range Status  07/21/2019 180 100 - 199 mg/dL Final         Passed - HDL in normal range and within 360 days    HDL  Date Value Ref Range Status  07/21/2019 43 >39 mg/dL Final         Passed - Patient is not pregnant      Passed - Valid encounter within last 12 months    Recent Outpatient Visits          3 months ago Annual physical exam   [place], [place], PA-C   10 months ago Hyperlipidemia, unspecified hyperlipidemia type   [facility] [place], Wendee Beavers, [place]   1 year ago Annual physical exam   [facility] Carles Collet M, [place]   1 year ago Acute bilateral low back pain without sciatica   [facility] [place], Wendee Beavers, [place]   1 year ago Essential hypertension   [facility] Trinna Post, [place]      Future Appointments            In 2 months Trinna Post, [place], [place]

## 2019-11-20 ENCOUNTER — Other Ambulatory Visit: Payer: Self-pay | Admitting: Physician Assistant

## 2019-11-20 DIAGNOSIS — I10 Essential (primary) hypertension: Secondary | ICD-10-CM

## 2019-11-21 ENCOUNTER — Other Ambulatory Visit: Payer: Self-pay | Admitting: Physician Assistant

## 2019-11-21 DIAGNOSIS — I10 Essential (primary) hypertension: Secondary | ICD-10-CM

## 2019-11-21 DIAGNOSIS — F419 Anxiety disorder, unspecified: Secondary | ICD-10-CM

## 2019-11-21 DIAGNOSIS — F329 Major depressive disorder, single episode, unspecified: Secondary | ICD-10-CM

## 2019-11-21 DIAGNOSIS — F32A Depression, unspecified: Secondary | ICD-10-CM

## 2019-11-21 NOTE — Telephone Encounter (Signed)
Requested Prescriptions  Pending Prescriptions Disp Refills  . sertraline (ZOLOFT) 100 MG tablet [Pharmacy Med Name: SERTRALINE 100MG  TABLETS] 90 tablet 0    Sig: TAKE 1 TABLET(100 MG) BY MOUTH DAILY     Psychiatry:  Antidepressants - SSRI Failed - 11/21/2019  6:19 AM      Failed - Valid encounter within last 6 months    Recent Outpatient Visits          4 months ago Annual physical exam   [place], [place], PA-C   10 months ago Hyperlipidemia, unspecified hyperlipidemia type   [facility] [place], Wendee Beavers, [place]   1 year ago Annual physical exam   [facility] Carles Collet M, [place]   1 year ago Acute bilateral low back pain without sciatica   [facility] Trinna Post, [place]   2 years ago Essential hypertension   [facility] Trinna Post, [place]      Future Appointments            In 1 month Trinna Post, PA-C [employer], [facility]           Passed - Completed PHQ-2 or PHQ-9 in the last 360 days.      . metoprolol tartrate (LOPRESSOR) 25 MG tablet [Pharmacy Med Name: METOPROLOL TARTRATE 25MG  TABLETS] 180 tablet 0    Sig: TAKE 1 TABLET(25 MG) BY MOUTH TWICE DAILY     Cardiovascular:  Beta Blockers Failed - 11/21/2019  6:19 AM      Failed - Last BP in normal range    BP Readings from Last 1 Encounters:  01/05/19 (!) 146/87         Failed - Valid encounter within last 6 months    Recent Outpatient Visits          4 months ago Annual physical exam   [place], [place], PA-C   10 months ago Hyperlipidemia, unspecified hyperlipidemia type   [facility] [place], Wendee Beavers, [place]   1 year ago Annual physical exam   [facility] Carles Collet M, [place]   1 year ago Acute bilateral low back pain without sciatica   [facility] Trinna Post, [place]   2 years ago Essential hypertension   [facility] Trinna Post, [place]      Future Appointments            In 1 month Trinna Post, PA-C [employer], [facility]           Passed - Last Heart Rate in normal range    Pulse Readings from Last 1 Encounters:  01/05/19 72         Patient had physical - a valid encounter - 4 months ago.

## 2020-01-18 ENCOUNTER — Ambulatory Visit: Payer: 59 | Admitting: Physician Assistant

## 2020-01-18 ENCOUNTER — Other Ambulatory Visit: Payer: Self-pay

## 2020-01-18 ENCOUNTER — Encounter: Payer: Self-pay | Admitting: Physician Assistant

## 2020-01-18 VITALS — BP 128/88 | HR 74 | Temp 98.2°F | Wt 192.6 lb

## 2020-01-18 DIAGNOSIS — F329 Major depressive disorder, single episode, unspecified: Secondary | ICD-10-CM

## 2020-01-18 DIAGNOSIS — I10 Essential (primary) hypertension: Secondary | ICD-10-CM | POA: Diagnosis not present

## 2020-01-18 DIAGNOSIS — E785 Hyperlipidemia, unspecified: Secondary | ICD-10-CM | POA: Diagnosis not present

## 2020-01-18 DIAGNOSIS — F32A Depression, unspecified: Secondary | ICD-10-CM

## 2020-01-18 NOTE — Patient Instructions (Signed)

## 2020-01-18 NOTE — Progress Notes (Signed)
Established patient visit   Patient: Roy Little   DOB: 25-Jan-1966   54 y.o. Male  MRN: [id number] Visit Date: 01/18/2020  Today's healthcare provider: Trinna Post, PA-C   Chief Complaint  Patient presents with  . Hyperlipidemia  . Hypertension  I,Maudean Hoffmann M Edgel Degnan,acting as a scribe for [employer], PA-C.,have documented all relevant documentation on the behalf of Trinna Post, PA-C,as directed by  Trinna Post, PA-C while in the presence of Trinna Post, PA-C.  Subjective    HPI  Hypertension, follow-up  BP Readings from Last 3 Encounters:  01/18/20 128/88  01/05/19 (!) 146/87  07/06/18 (!) 150/89   Wt Readings from Last 3 Encounters:  01/18/20 192 lb 9.6 oz (87.4 kg)  01/05/19 194 lb 12.8 oz (88.4 kg)  07/06/18 193 lb (87.5 kg)     He was last seen for hypertension 6 months ago.  BP at that visit was 146/87. Management since that visit includes no changes.  He reports good compliance with treatment. He is not having side effects.  He is following a Regular diet. He is exercising. He does not smoke.  Use of agents associated with hypertension: none.   Outside blood pressures are arranges 120's-130's/70's-80's. Symptoms: No chest pain No chest pressure  No palpitations No syncope  No dyspnea No orthopnea  No paroxysmal nocturnal dyspnea No lower extremity edema   Pertinent labs: Lab Results  Component Value Date   CHOL 180 07/21/2019   HDL 43 07/21/2019   LDLCALC 99 07/21/2019   TRIG 223 (H) 07/21/2019   CHOLHDL 4.2 07/21/2019   Lab Results  Component Value Date   NA 139 07/21/2019   K 4.2 07/21/2019   CREATININE 0.88 07/21/2019   GFRNONAA 97 07/21/2019   GFRAA 112 07/21/2019   GLUCOSE 97 07/21/2019     The 10-year ASCVD risk score Mikey Bussing DC Jr., et al., 2013) is: 7.2%   Lipid/Cholesterol, Follow-up  Last lipid panel Other pertinent labs  Lab Results  Component Value Date   CHOL 180 07/21/2019   HDL 43 07/21/2019     LDLCALC 99 07/21/2019   TRIG 223 (H) 07/21/2019   CHOLHDL 4.2 07/21/2019   Lab Results  Component Value Date   ALT 17 07/21/2019   AST 19 07/21/2019   PLT 326 07/21/2019   TSH 1.720 07/21/2019     He was last seen for this 6 months ago.  Management since that visit includes increased Lipitor to 40mg .  He reports good compliance with treatment. He is not having side effects.   Symptoms: No chest pain No chest pressure/discomfort  No dyspnea No lower extremity edema  No numbness or tingling of extremity No orthopnea  No palpitations No paroxysmal nocturnal dyspnea  No speech difficulty No syncope   Current diet: well balanced Current exercise: walking and golf  The 10-year ASCVD risk score Mikey Bussing DC Jr., et al., 2013) is: 7.2%  Depression, Follow-up  He  was last seen for this 6 months ago. Changes made at last visit include continue zoloft.   He reports excellent compliance with treatment. He is not having side effects.   He reports excellent tolerance of treatment. Current symptoms include: none He feels he is Unchanged since last visit.  Depression screen [facility] 2/9 07/21/2019 07/06/2018 06/30/2017  Decreased Interest 0 0 1  Down, Depressed, Hopeless 0 0 1  PHQ - 2 Score 0 0 2  Altered sleeping 0 0 1  Tired, decreased  energy 0 0 1  Change in appetite 0 0 1  Feeling bad or failure about yourself  0 0 1  Trouble concentrating 0 0 1  Moving slowly or fidgety/restless 0 0 0  Suicidal thoughts 0 0 0  PHQ-9 Score 0 0 7  Difficult doing work/chores Not difficult at all Not difficult at all Somewhat difficult    -----------------------------------------------------------------------------------------  ---------------------------------------------------------------------------------------------------      Medications: Outpatient Medications Prior to Visit  Medication Sig  . amLODipine (NORVASC) 10 MG tablet TAKE 1 TABLET(10 MG) BY MOUTH DAILY  . atorvastatin  (LIPITOR) 40 MG tablet TAKE 1 TABLET(40 MG) BY MOUTH AT BEDTIME  . finasteride (PROSCAR) 5 MG tablet Take 1 tablet (5 mg total) by mouth daily.  Marland Kitchen lisinopril (ZESTRIL) 40 MG tablet TAKE 1 TABLET(40 MG) BY MOUTH DAILY  . metoprolol tartrate (LOPRESSOR) 25 MG tablet TAKE 1 TABLET(25 MG) BY MOUTH TWICE DAILY  . sertraline (ZOLOFT) 100 MG tablet TAKE 1 TABLET(100 MG) BY MOUTH DAILY   No facility-administered medications prior to visit.    Review of Systems  Constitutional: Negative.   Respiratory: Negative.   Cardiovascular: Negative.   Musculoskeletal: Negative.   Neurological: Negative.       Objective    BP 128/88 (BP Location: Left Arm, Patient Position: Sitting, Cuff Size: Normal)   Pulse 74   Temp 98.2 F (36.8 C) (Oral)   Wt 192 lb 9.6 oz (87.4 kg)   SpO2 98%   BMI 28.44 kg/m    Physical Exam Constitutional:      Appearance: Normal appearance.  Cardiovascular:     Rate and Rhythm: Normal rate and regular rhythm.     Pulses: Normal pulses.     Heart sounds: Normal heart sounds.  Pulmonary:     Effort: Pulmonary effort is normal.     Breath sounds: Normal breath sounds.  Skin:    General: Skin is warm and dry.  Neurological:     General: No focal deficit present.     Mental Status: He is alert and oriented to person, place, and time.  Psychiatric:        Mood and Affect: Mood normal.        Behavior: Behavior normal.       No results found for any visits on 01/18/20.  Assessment & Plan    1. Hypertension, unspecified type Well controlled Continue current medications   2. Hyperlipidemia, unspecified hyperlipidemia type Previously well controlled Continue statin Goal LDL < 100   3. Depression  Continue SSRI  Return in about 6 months (around 07/20/2020) for CPE, HTN & HLD.      I, Trinna Post, PA-C, have reviewed all documentation for this visit. The documentation on 01/19/20 for the exam, diagnosis, procedures, and orders are all accurate and  complete.    Paulene Floor  [facility] [phone] (phone) [phone] (fax)  [place]

## 2020-02-19 ENCOUNTER — Other Ambulatory Visit: Payer: Self-pay | Admitting: Physician Assistant

## 2020-02-19 DIAGNOSIS — I10 Essential (primary) hypertension: Secondary | ICD-10-CM

## 2020-03-14 ENCOUNTER — Other Ambulatory Visit: Payer: Self-pay | Admitting: Physician Assistant

## 2020-03-14 DIAGNOSIS — I10 Essential (primary) hypertension: Secondary | ICD-10-CM

## 2020-05-19 ENCOUNTER — Other Ambulatory Visit: Payer: Self-pay | Admitting: Physician Assistant

## 2020-05-19 DIAGNOSIS — F32A Depression, unspecified: Secondary | ICD-10-CM

## 2020-05-19 DIAGNOSIS — I10 Essential (primary) hypertension: Secondary | ICD-10-CM

## 2020-07-21 ENCOUNTER — Encounter: Payer: Self-pay | Admitting: Physician Assistant

## 2020-08-08 ENCOUNTER — Encounter: Payer: Self-pay | Admitting: Physician Assistant

## 2020-08-17 ENCOUNTER — Other Ambulatory Visit: Payer: Self-pay | Admitting: Physician Assistant

## 2020-08-17 DIAGNOSIS — F32A Depression, unspecified: Secondary | ICD-10-CM

## 2020-08-25 ENCOUNTER — Other Ambulatory Visit: Payer: Self-pay | Admitting: Physician Assistant

## 2020-09-14 ENCOUNTER — Other Ambulatory Visit: Payer: Self-pay | Admitting: Physician Assistant

## 2020-09-14 DIAGNOSIS — I10 Essential (primary) hypertension: Secondary | ICD-10-CM

## 2020-09-14 DIAGNOSIS — R350 Frequency of micturition: Secondary | ICD-10-CM

## 2020-09-14 DIAGNOSIS — L659 Nonscarring hair loss, unspecified: Secondary | ICD-10-CM

## 2020-10-02 ENCOUNTER — Telehealth: Payer: Self-pay | Admitting: Physician Assistant

## 2020-10-02 DIAGNOSIS — F419 Anxiety disorder, unspecified: Secondary | ICD-10-CM

## 2020-10-02 DIAGNOSIS — F32A Depression, unspecified: Secondary | ICD-10-CM

## 2020-10-02 DIAGNOSIS — I10 Essential (primary) hypertension: Secondary | ICD-10-CM

## 2020-10-02 MED ORDER — SERTRALINE HCL 100 MG PO TABS: ORAL_TABLET | ORAL | 0 refills | Status: DC

## 2020-10-02 MED ORDER — LISINOPRIL 40 MG PO TABS: ORAL_TABLET | ORAL | 0 refills | Status: DC

## 2020-10-02 NOTE — Telephone Encounter (Signed)
[facility] faxed refill request for the following medications:  sertraline (ZOLOFT) 100 MG tablet   lisinopril (ZESTRIL) 40 MG tablet   Insurance requires 90 day supply  Please advise.

## 2020-10-02 NOTE — Telephone Encounter (Signed)
Patient has follow up appt with Dr. Brita Romp on 10/06/20 will refill for 90day .KW

## 2020-10-02 NOTE — Telephone Encounter (Signed)
Medication: sertraline (ZOLOFT) 100 MG tablet [[id number]], lisinopril (ZESTRIL) 40 MG tablet [[id number]]   Has the patient contacted their pharmacy? YES  (Agent: If no, request that the patient contact the pharmacy for the refill.) (Agent: If yes, when and what did the pharmacy advise?)  Preferred Pharmacy (with phone number or street name): [id number] [address], [place] [address] [place] [address] Phone: [phone] Fax: [phone] Hours: Not open 24 hours    Agent: Please be advised that RX refills may take up to 3 business days. We ask that you follow-up with your pharmacy.

## 2020-10-06 ENCOUNTER — Ambulatory Visit: Payer: 59 | Admitting: Family Medicine

## 2020-10-06 ENCOUNTER — Other Ambulatory Visit: Payer: Self-pay

## 2020-10-06 ENCOUNTER — Encounter: Payer: Self-pay | Admitting: Family Medicine

## 2020-10-06 VITALS — BP 116/72 | HR 83 | Temp 98.4°F | Resp 16 | Ht 69.0 in | Wt 187.9 lb

## 2020-10-06 DIAGNOSIS — I10 Essential (primary) hypertension: Secondary | ICD-10-CM

## 2020-10-06 DIAGNOSIS — F3341 Major depressive disorder, recurrent, in partial remission: Secondary | ICD-10-CM | POA: Diagnosis not present

## 2020-10-06 DIAGNOSIS — F419 Anxiety disorder, unspecified: Secondary | ICD-10-CM

## 2020-10-06 DIAGNOSIS — E782 Mixed hyperlipidemia: Secondary | ICD-10-CM

## 2020-10-06 DIAGNOSIS — F411 Generalized anxiety disorder: Secondary | ICD-10-CM | POA: Insufficient documentation

## 2020-10-06 DIAGNOSIS — L659 Nonscarring hair loss, unspecified: Secondary | ICD-10-CM | POA: Insufficient documentation

## 2020-10-06 DIAGNOSIS — F32A Depression, unspecified: Secondary | ICD-10-CM

## 2020-10-06 MED ORDER — SERTRALINE HCL 100 MG PO TABS: ORAL_TABLET | ORAL | 0 refills | Status: DC

## 2020-10-06 MED ORDER — ATORVASTATIN CALCIUM 40 MG PO TABS: ORAL_TABLET | ORAL | 1 refills | Status: DC

## 2020-10-06 MED ORDER — AMLODIPINE BESYLATE 10 MG PO TABS: ORAL_TABLET | ORAL | 1 refills | Status: DC

## 2020-10-06 MED ORDER — METOPROLOL TARTRATE 25 MG PO TABS
ORAL_TABLET | ORAL | 1 refills | Status: DC
Start: 2020-10-06 — End: 2021-05-24

## 2020-10-06 MED ORDER — LISINOPRIL 40 MG PO TABS: ORAL_TABLET | ORAL | 1 refills | Status: DC

## 2020-10-06 NOTE — Assessment & Plan Note (Signed)
Well controlled Continue sertraline at current dose

## 2020-10-06 NOTE — Assessment & Plan Note (Signed)
Chronic and well controlled Continue sertraline at current dose 

## 2020-10-06 NOTE — Assessment & Plan Note (Signed)
Followed by dermatology and taking finasteride for this

## 2020-10-06 NOTE — Assessment & Plan Note (Signed)
Well controlled Continue current medications Recheck metabolic panel F/u in 6 months  

## 2020-10-06 NOTE — Progress Notes (Signed)
Established patient visit   Patient: Roy Little   DOB: 02/28/1966   55 y.o. Male  MRN: [id number] Visit Date: 10/06/2020  Today's healthcare provider: Lavon Paganini, MD   Chief Complaint  Patient presents with   Depression   Anxiety   Hypertension   Subjective    HPI   Depression, Follow-up  He  was last seen for this 8 months ago. Changes made at last visit include no changes.   He reports excellent compliance with treatment. He is not having side effects.   He reports excellent tolerance of treatment. Current symptoms include: fatigue and insomnia He feels he is Unchanged since last visit.  Depression screen [facility] 2/9 10/06/2020 07/21/2019 07/06/2018  Decreased Interest 0 0 0  Down, Depressed, Hopeless 0 0 0  PHQ - 2 Score 0 0 0  Altered sleeping 1 0 0  Tired, decreased energy 1 0 0  Change in appetite 0 0 0  Feeling bad or failure about yourself  0 0 0  Trouble concentrating 0 0 0  Moving slowly or fidgety/restless 0 0 0  Suicidal thoughts 0 0 0  PHQ-9 Score 2 0 0  Difficult doing work/chores Not difficult at all Not difficult at all Not difficult at all    ----------------------------------------------------------------------------------------- Anxiety, Follow-up  He was last seen for anxiety 8 months ago. Changes made at last visit include no changes.   He reports excellent compliance with treatment. He reports excellent tolerance of treatment. He is not having side effects.  He feels his anxiety is mild and Improved since last visit.  Symptoms: No chest pain Yes difficulty concentrating  Yes dizziness Yes fatigue  No feelings of losing control Yes insomnia  Yes irritable No palpitations  No panic attacks No racing thoughts  No shortness of breath No sweating  Yes tremors/shakes    GAD-7 Results GAD-7 Generalized Anxiety Disorder Screening Tool 10/06/2020 06/30/2017  1. Feeling Nervous, Anxious, or on Edge 0 1  2. Not Being Able to  Stop or Control Worrying 1 1  3. Worrying Too Much About Different Things 1 0  4. Trouble Relaxing 0 1  5. Being So Restless it's Hard To Sit Still 0 1  6. Becoming Easily Annoyed or Irritable 0 2  7. Feeling Afraid As If Something Awful Might Happen 0 1  Total GAD-7 Score 2 7  Difficulty At Work, Home, or Getting  Along With Others? Not difficult at all Somewhat difficult    PHQ-9 Scores PHQ9 SCORE ONLY 10/06/2020 07/21/2019 07/06/2018  PHQ-9 Total Score 2 0 0    --------------------------------------------------------------------------------------------------- Hypertension, follow-up  BP Readings from Last 3 Encounters:  10/06/20 116/72  01/18/20 128/88  01/05/19 (!) 146/87   Wt Readings from Last 3 Encounters:  10/06/20 187 lb 14.4 oz (85.2 kg)  01/18/20 192 lb 9.6 oz (87.4 kg)  01/05/19 194 lb 12.8 oz (88.4 kg)     He was last seen for hypertension 8 months ago.  BP at that visit was 128/88. Management since that visit includes no changes.  He reports excellent compliance with treatment. He is not having side effects.  He is following a Regular diet. He is exercising. He does not smoke.  Use of agents associated with hypertension: none.   Outside blood pressures are stable. Symptoms: No chest pain No chest pressure  No palpitations No syncope  No dyspnea No orthopnea  No paroxysmal nocturnal dyspnea No lower extremity edema   Pertinent labs: Lab Results  Component Value Date   CHOL 180 07/21/2019   HDL 43 07/21/2019   LDLCALC 99 07/21/2019   TRIG 223 (H) 07/21/2019   CHOLHDL 4.2 07/21/2019   Lab Results  Component Value Date   NA 139 07/21/2019   K 4.2 07/21/2019   CREATININE 0.88 07/21/2019   GFRNONAA 97 07/21/2019   GFRAA 112 07/21/2019   GLUCOSE 97 07/21/2019     The 10-year ASCVD risk score Mikey Bussing DC Jr., et al., 2013) is: 6.1%   ---------------------------------------------------------------------------------------------------   Patient Active  Problem List   Diagnosis Date Noted   GAD (generalized anxiety disorder) 10/06/2020   Hair loss 10/06/2020   Hyperlipidemia 11/12/2017   Depression 11/12/2017   Hypertension 08/01/2017   Social History   Socioeconomic History   Marital status: Married    Spouse name: Not on file   Number of children: Not on file   Years of education: Not on file   Highest education level: Not on file  Occupational History   Not on file  Tobacco Use   Smoking status: Never Smoker   Smokeless tobacco: Never Used  Vaping Use   Vaping Use: Never used  Substance and Sexual Activity   Alcohol use: Yes    Comment: Very Rarely   Drug use: No   Sexual activity: Yes  Other Topics Concern   Not on file  Social History Narrative   Not on file   Social Determinants of Health   Financial Resource Strain: Not on file  Food Insecurity: Not on file  Transportation Needs: Not on file  Physical Activity: Not on file  Stress: Not on file  Social Connections: Not on file  Intimate Partner Violence: Not on file   No Known Allergies     Medications: Outpatient Medications Prior to Visit  Medication Sig   finasteride (PROSCAR) 5 MG tablet Take 5 mg by mouth daily.   [DISCONTINUED] amLODipine (NORVASC) 10 MG tablet TAKE 1 TABLET(10 MG) BY MOUTH DAILY   [DISCONTINUED] atorvastatin (LIPITOR) 40 MG tablet TAKE 1 TABLET(40 MG) BY MOUTH AT BEDTIME   [DISCONTINUED] lisinopril (ZESTRIL) 40 MG tablet TAKE 1 TABLET(40 MG) BY MOUTH DAILY   [DISCONTINUED] metoprolol tartrate (LOPRESSOR) 25 MG tablet TAKE 1 TABLET(25 MG) BY MOUTH TWICE DAILY   [DISCONTINUED] sertraline (ZOLOFT) 100 MG tablet TAKE 1 TABLET(100 MG) BY MOUTH DAILY   No facility-administered medications prior to visit.    Review of Systems  Constitutional: Positive for fatigue. Negative for activity change, appetite change and chills.  Respiratory: Negative for cough, chest tightness, shortness of breath and wheezing.    Gastrointestinal: Negative for abdominal pain, constipation, diarrhea, nausea and vomiting.  Allergic/Immunologic: Positive for environmental allergies.  Neurological: Positive for tremors. Negative for dizziness and headaches.  Psychiatric/Behavioral: Positive for decreased concentration and sleep disturbance. The patient is nervous/anxious.     Last CBC Lab Results  Component Value Date   WBC 6.8 07/21/2019   HGB 14.6 07/21/2019   HCT 42.1 07/21/2019   MCV 89 07/21/2019   MCH 30.9 07/21/2019   RDW 12.3 07/21/2019   PLT 326 07/21/2019   Last thyroid functions Lab Results  Component Value Date   TSH 1.720 07/21/2019       Objective    BP 116/72 (BP Location: Left Arm, Patient Position: Sitting, Cuff Size: Large)    Pulse 83    Temp 98.4 F (36.9 C) (Oral)    Resp 16    Ht 5\' 9"  (1.753 m)    Wt  187 lb 14.4 oz (85.2 kg)    SpO2 99%    BMI 27.75 kg/m  BP Readings from Last 3 Encounters:  10/06/20 116/72  01/18/20 128/88  01/05/19 (!) 146/87   Wt Readings from Last 3 Encounters:  10/06/20 187 lb 14.4 oz (85.2 kg)  01/18/20 192 lb 9.6 oz (87.4 kg)  01/05/19 194 lb 12.8 oz (88.4 kg)      Physical Exam Vitals reviewed.  Constitutional:      General: He is not in acute distress.    Appearance: Normal appearance. He is not diaphoretic.  HENT:     Head: Normocephalic and atraumatic.  Eyes:     General: No scleral icterus.    Conjunctiva/sclera: Conjunctivae normal.  Cardiovascular:     Rate and Rhythm: Normal rate and regular rhythm.     Pulses: Normal pulses.     Heart sounds: Normal heart sounds. No murmur heard.   Pulmonary:     Effort: Pulmonary effort is normal. No respiratory distress.     Breath sounds: Normal breath sounds. No wheezing or rhonchi.  Abdominal:     General: There is no distension.     Palpations: Abdomen is soft.     Tenderness: There is no abdominal tenderness.  Musculoskeletal:     Cervical back: Neck supple.     Right lower leg: No  edema.     Left lower leg: No edema.  Lymphadenopathy:     Cervical: No cervical adenopathy.  Skin:    General: Skin is warm and dry.     Capillary Refill: Capillary refill takes less than 2 seconds.     Findings: No rash.  Neurological:     Mental Status: He is alert and oriented to person, place, and time.     Cranial Nerves: No cranial nerve deficit.  Psychiatric:        Mood and Affect: Mood normal.        Behavior: Behavior normal.       No results found for any visits on 10/06/20.  Assessment & Plan     Problem List Items Addressed This Visit      Cardiovascular and Mediastinum   Hypertension - Primary    Well controlled Continue current medications Recheck metabolic panel F/u in 6 months       Relevant Medications   amLODipine (NORVASC) 10 MG tablet   atorvastatin (LIPITOR) 40 MG tablet   lisinopril (ZESTRIL) 40 MG tablet   metoprolol tartrate (LOPRESSOR) 25 MG tablet   Other Relevant Orders   Comprehensive metabolic panel     Other   Hyperlipidemia    Previously well controlled Continue statin Repeat FLP and CMP Goal LDL < 100      Relevant Medications   amLODipine (NORVASC) 10 MG tablet   atorvastatin (LIPITOR) 40 MG tablet   lisinopril (ZESTRIL) 40 MG tablet   metoprolol tartrate (LOPRESSOR) 25 MG tablet   Other Relevant Orders   Comprehensive metabolic panel   Lipid panel   Depression    Well controlled Continue sertraline at current dose      Relevant Medications   sertraline (ZOLOFT) 100 MG tablet   GAD (generalized anxiety disorder)    Chronic and well-controlled Continue sertraline at current dose      Relevant Medications   sertraline (ZOLOFT) 100 MG tablet   Hair loss    Followed by dermatology and taking finasteride for this       Other Visit Diagnoses    Anxiety  and depression       Relevant Medications   sertraline (ZOLOFT) 100 MG tablet       Return in about 6 months (around 04/08/2021) for CPE.      I, Lavon Paganini, MD, have reviewed all documentation for this visit. The documentation on 10/06/20 for the exam, diagnosis, procedures, and orders are all accurate and complete.   Jabir Dahlem, Dionne Bucy, MD, MPH [facility]

## 2020-10-06 NOTE — Assessment & Plan Note (Signed)
Previously well controlled Continue statin Repeat FLP and CMP Goal LDL < 100

## 2020-10-07 LAB — LIPID PANEL
Chol/HDL Ratio: 4.1 ratio (ref 0.0–5.0)
Cholesterol, Total: 177 mg/dL (ref 100–199)
HDL: 43 mg/dL (ref >39)
LDL Chol Calc (NIH): 111 mg/dL — ABNORMAL HIGH (ref 0–99)
Triglycerides: 129 mg/dL (ref 0–149)
VLDL Cholesterol Cal: 23 mg/dL (ref 5–40)

## 2020-10-07 LAB — COMPREHENSIVE METABOLIC PANEL
ALT: 22 IU/L (ref 0–44)
AST: 19 IU/L (ref 0–40)
Albumin/Globulin Ratio: 2 (ref 1.2–2.2)
Albumin: 4.5 g/dL (ref 3.8–4.9)
Alkaline Phosphatase: 117 IU/L (ref 44–121)
BUN/Creatinine Ratio: 18 (ref 9–20)
BUN: 17 mg/dL (ref 6–24)
Bilirubin Total: 0.4 mg/dL (ref 0.0–1.2)
CO2: 20 mmol/L (ref 20–29)
Calcium: 8.8 mg/dL (ref 8.7–10.2)
Chloride: 102 mmol/L (ref 96–106)
Creatinine, Ser: 0.97 mg/dL (ref 0.76–1.27)
Globulin, Total: 2.2 g/dL (ref 1.5–4.5)
Glucose: 105 mg/dL — ABNORMAL HIGH (ref 65–99)
Potassium: 4.1 mmol/L (ref 3.5–5.2)
Sodium: 139 mmol/L (ref 134–144)
Total Protein: 6.7 g/dL (ref 6.0–8.5)
eGFR: 92 mL/min/{1.73_m2} (ref >59)

## 2020-11-06 ENCOUNTER — Telehealth: Payer: Self-pay | Admitting: Family Medicine

## 2020-11-06 MED ORDER — FINASTERIDE 5 MG PO TABS: 5.0000 mg | ORAL_TABLET | Freq: Every day | ORAL | 1 refills | Status: DC

## 2020-11-06 NOTE — Telephone Encounter (Signed)
[facility] faxed refill request for the following medications:  finasteride (PROSCAR) 5 MG tablet  90 day supply  Last Rx: 07/21/19 by Adriana LOV: 01/18/20 NOV: 03/26/21 w/Dr. B Please advise. Thanks TNP

## 2020-11-06 NOTE — Addendum Note (Signed)
Addended by: Althea Charon D on: 11/06/2020 11:41 AM   Modules accepted: Orders

## 2020-11-06 NOTE — Telephone Encounter (Signed)
90 days with 1 rf

## 2020-11-13 ENCOUNTER — Telehealth: Payer: Self-pay

## 2020-11-13 NOTE — Telephone Encounter (Signed)
Medication last filled 10/06/20 for qty of 180tabs ( equivalent to 90day supply) with one additional refill. Will deny request patient should have another refill. KW

## 2020-11-13 NOTE — Telephone Encounter (Signed)
[facility] faxed refill request for the following medications:   metoprolol tartrate (LOPRESSOR) 25 MG tablet    Please advise.  

## 2020-12-15 ENCOUNTER — Telehealth: Payer: Self-pay | Admitting: Family Medicine

## 2020-12-15 MED ORDER — NIRMATRELVIR/RITONAVIR (PAXLOVID)TABLET: 3.0000 | ORAL_TABLET | Freq: Two times a day (BID) | ORAL | 0 refills | Status: AC

## 2020-12-15 NOTE — Telephone Encounter (Signed)
I called and spoke with patient. He reports that he has no taste or smell. He has coughing spells, extreme fatigue, body aches (improved today) and low grade fever. Symptoms started 12/11/2020. He has had 2 doses of Pfizer COVID vaccine. Immunizations dates: 09/09/2019, 09/30/2019.   Pharmacy: [facility]

## 2020-12-15 NOTE — Telephone Encounter (Signed)
Need to know what symptoms he is having, when symptoms started, and when his last covid vaccine was.

## 2020-12-15 NOTE — Telephone Encounter (Signed)
Please advise. Patient reports symptoms started on 12/11/20. GFR done in the last 2 months.

## 2020-12-15 NOTE — Telephone Encounter (Signed)
Patient's wife is calling to ask if patient can have some medication called into pharmacy after testing positive for covid yesterday.  Please advise and call to discuss at [phone] or call wife at [phone]

## 2020-12-15 NOTE — Telephone Encounter (Signed)
Have sent prescription for Paxlovid to [facility], it's important to start it as soon as the pharmacy fills.

## 2020-12-30 ENCOUNTER — Other Ambulatory Visit: Payer: Self-pay | Admitting: Family Medicine

## 2020-12-30 DIAGNOSIS — I10 Essential (primary) hypertension: Secondary | ICD-10-CM

## 2020-12-30 NOTE — Telephone Encounter (Signed)
last RF 10/06/20 #90 1 RF

## 2021-01-16 ENCOUNTER — Encounter: Payer: Self-pay | Admitting: Family Medicine

## 2021-02-16 ENCOUNTER — Telehealth: Payer: Self-pay

## 2021-02-16 DIAGNOSIS — I10 Essential (primary) hypertension: Secondary | ICD-10-CM

## 2021-02-16 MED ORDER — AMLODIPINE BESYLATE 10 MG PO TABS: ORAL_TABLET | ORAL | 1 refills | Status: DC

## 2021-02-16 NOTE — Telephone Encounter (Signed)
[facility] faxed refill request for the following medications: ° °amLODipine (NORVASC) 10 MG tablet  ° ° °Please advise. °

## 2021-02-27 ENCOUNTER — Telehealth: Payer: Self-pay | Admitting: Family Medicine

## 2021-02-27 DIAGNOSIS — F419 Anxiety disorder, unspecified: Secondary | ICD-10-CM

## 2021-02-27 DIAGNOSIS — F32A Depression, unspecified: Secondary | ICD-10-CM

## 2021-02-27 MED ORDER — SERTRALINE HCL 100 MG PO TABS
ORAL_TABLET | ORAL | 1 refills | Status: DC
Start: 2021-02-27 — End: 2021-08-22

## 2021-02-27 NOTE — Telephone Encounter (Signed)
[facility] faxed refill request for the following medications:  sertraline (ZOLOFT) 100 MG tablet   Please advise.  

## 2021-03-26 ENCOUNTER — Other Ambulatory Visit: Payer: Self-pay

## 2021-03-26 ENCOUNTER — Encounter: Payer: Self-pay | Admitting: Family Medicine

## 2021-03-26 ENCOUNTER — Ambulatory Visit (INDEPENDENT_AMBULATORY_CARE_PROVIDER_SITE_OTHER): Payer: 59 | Admitting: Family Medicine

## 2021-03-26 VITALS — BP 156/92 | HR 73 | Temp 98.5°F | Resp 16 | Ht 69.0 in | Wt 184.4 lb

## 2021-03-26 DIAGNOSIS — Z0000 Encounter for general adult medical examination without abnormal findings: Secondary | ICD-10-CM | POA: Diagnosis not present

## 2021-03-26 DIAGNOSIS — Z1211 Encounter for screening for malignant neoplasm of colon: Secondary | ICD-10-CM | POA: Diagnosis not present

## 2021-03-26 DIAGNOSIS — F411 Generalized anxiety disorder: Secondary | ICD-10-CM

## 2021-03-26 DIAGNOSIS — E782 Mixed hyperlipidemia: Secondary | ICD-10-CM | POA: Diagnosis not present

## 2021-03-26 DIAGNOSIS — Z23 Encounter for immunization: Secondary | ICD-10-CM | POA: Diagnosis not present

## 2021-03-26 DIAGNOSIS — I10 Essential (primary) hypertension: Secondary | ICD-10-CM

## 2021-03-26 DIAGNOSIS — F325 Major depressive disorder, single episode, in full remission: Secondary | ICD-10-CM

## 2021-03-26 DIAGNOSIS — R739 Hyperglycemia, unspecified: Secondary | ICD-10-CM

## 2021-03-26 MED ORDER — HYDROCHLOROTHIAZIDE 12.5 MG PO TABS: 12.5000 mg | ORAL_TABLET | Freq: Every day | ORAL | 3 refills | Status: DC

## 2021-03-26 NOTE — Assessment & Plan Note (Addendum)
-   Chronic and poorly-controlled - Start HCTZ - Continue Amlodipine, Lisinopril, & Metoprolol - Counseled pt. to regularly check BP at home - Recheck CMP - Follow-up in 3 months

## 2021-03-26 NOTE — Assessment & Plan Note (Signed)
-   Chronic and well-controlled - Continue Zoloft

## 2021-03-26 NOTE — Patient Instructions (Addendum)
Consider COVID Booster (Bivalent)

## 2021-03-26 NOTE — Assessment & Plan Note (Signed)
-   Chronic, previously well-controlled - Recheck lipid panel & CMP - Continue Atorvastatin

## 2021-03-26 NOTE — Progress Notes (Signed)
Annual CPE     Patient: TUPAC SUNDE, Male    DOB: 03/13/1966, 55 y.o.   MRN: [id number] Visit Date: 03/26/2021  Today's Provider: Lavon Paganini, MD   Chief Complaint  Patient presents with   Annual Exam   Subjective    KENDAL GAW is a 55 y.o. male who presents today for his CPE. He reports consuming a general diet. Home exercise routine includes calisthenics. He generally feels well. He reports sleeping well. He does not have additional problems to discuss today.   HPI Hypertension - Currently taking Amlodipine, Lisinopril, & Metoprolol, denies side effects - Denies chest pain, SOB, & leg swelling  Hyperlipidemia - Currently taking Atorvastatin, denies myalgias  Depression and Anxiety - Currently taking Zoloft - Reports adequate control over depression/anxiety symptoms  Post-COVID Viral Sequelae - Pt. reports that he had COVID 2 months ago and recovered without complications - Endorses mild, persistent changes in taste, but denies anosmia - Denies persistent cough, SOB, or daytime fatigue  Health Maintenance - Due for updated Cologuard screening - Due for flu & shingles vaccines; due for new COVID booster   Medications: Outpatient Medications Prior to Visit  Medication Sig   amLODipine (NORVASC) 10 MG tablet TAKE 1 TABLET(10 MG) BY MOUTH DAILY   atorvastatin (LIPITOR) 40 MG tablet TAKE 1 TABLET(40 MG) BY MOUTH AT BEDTIME   finasteride (PROSCAR) 5 MG tablet Take 1 tablet (5 mg total) by mouth daily.   lisinopril (ZESTRIL) 40 MG tablet TAKE 1 TABLET(40 MG) BY MOUTH DAILY   metoprolol tartrate (LOPRESSOR) 25 MG tablet TAKE 1 TABLET(25 MG) BY MOUTH TWICE DAILY   sertraline (ZOLOFT) 100 MG tablet TAKE 1 TABLET(100 MG) BY MOUTH DAILY   No facility-administered medications prior to visit.    No Known Allergies  Patient Care Team: Virginia Crews, MD as PCP - General (Family Medicine)  Review of Systems  Constitutional:  Negative for activity  change, appetite change, fatigue and fever.  HENT:         Change in taste s/p COVID infection  Eyes: Negative.   Respiratory:  Negative for chest tightness and shortness of breath.   Cardiovascular:  Negative for chest pain and leg swelling.  Gastrointestinal: Negative.   Endocrine: Negative.   Genitourinary: Negative.   Musculoskeletal: Negative.   Skin: Negative.   Psychiatric/Behavioral: Negative.          Objective    Vitals: BP (!) 156/92   Pulse 73   Temp 98.5 F (36.9 C) (Oral)   Resp 16   Ht '5\' 9"'$  (1.753 m)   Wt 184 lb 6.4 oz (83.6 kg)   BMI 27.23 kg/m     Physical Exam Constitutional:      General: He is not in acute distress.    Appearance: Normal appearance.  HENT:     Head: Normocephalic and atraumatic.  Eyes:     Conjunctiva/sclera: Conjunctivae normal.  Cardiovascular:     Rate and Rhythm: Normal rate and regular rhythm.     Pulses: Normal pulses.     Heart sounds: Normal heart sounds.  Pulmonary:     Effort: Pulmonary effort is normal.     Breath sounds: Normal breath sounds.  Abdominal:     General: Bowel sounds are normal.     Palpations: Abdomen is soft.  Musculoskeletal:     Right lower leg: No edema.     Left lower leg: No edema.  Skin:    General: Skin  is warm and dry.  Neurological:     Mental Status: He is alert.  Psychiatric:        Behavior: Behavior normal.        Thought Content: Thought content normal.     Most recent functional status assessment: In your present state of health, do you have any difficulty performing the following activities: 03/26/2021  Hearing? N  Vision? N  Difficulty concentrating or making decisions? Y  Walking or climbing stairs? N  Dressing or bathing? N  Doing errands, shopping? N  Some recent data might be hidden   Most recent fall risk assessment: Fall Risk  03/26/2021  Falls in the past year? 0  Number falls in past yr: 0  Injury with Fall? 0  Risk for fall due to : -  Follow up -     Most recent depression screenings: PHQ 2/9 Scores 03/26/2021 10/06/2020  PHQ - 2 Score 0 0  PHQ- 9 Score 0 2   Most recent cognitive screening: No flowsheet data found. Most recent Audit-C alcohol use screening Alcohol Use Disorder Test (AUDIT) 10/06/2020  1. How often do you have a drink containing alcohol? 1  2. How many drinks containing alcohol do you have on a typical day when you are drinking? 1  3. How often do you have six or more drinks on one occasion? 0  AUDIT-C Score 2  4. How often during the last year have you found that you were not able to stop drinking once you had started? -  5. How often during the last year have you failed to do what was normally expected from you because of drinking? -  6. How often during the last year have you needed a first drink in the morning to get yourself going after a heavy drinking session? -  7. How often during the last year have you had a feeling of guilt of remorse after drinking? -  8. How often during the last year have you been unable to remember what happened the night before because you had been drinking? -  9. Have you or someone else been injured as a result of your drinking? -  10. Has a relative or friend or a doctor or another health worker been concerned about your drinking or suggested you cut down? -  Alcohol Use Disorder Identification Test Final Score (AUDIT) -  Alcohol Brief Interventions/Follow-up AUDIT Score <7 follow-up not indicated   A score of 3 or more in women, and 4 or more in men indicates increased risk for alcohol abuse, EXCEPT if all of the points are from question 1   No results found for any visits on 03/26/21.  Assessment & Plan     Annual CPE done today including the all of the following: Reviewed patient's Family Medical History Reviewed and updated list of patient's medical providers Assessment of cognitive impairment was done Assessed patient's functional ability Established a written schedule for  health screening [place] Completed and Reviewed  Exercise Activities and Dietary recommendations  Goals   None     Immunization History  Administered Date(s) Administered   PFIZER(Purple Top)SARS-COV-2 Vaccination 09/09/2019, 09/30/2019   Tdap 06/30/2017    Health Maintenance  Topic Date Due   Zoster Vaccines- Shingrix (1 of 2) Never done   COVID-19 Vaccine (3 - Booster for Pfizer series) 03/01/2020   Fecal DNA (Cologuard)  07/22/2020   INFLUENZA VACCINE  Never done   TETANUS/TDAP  07/01/2027  Hepatitis C Screening  Completed   HIV Screening  Completed   Pneumococcal Vaccine 44-94 Years old  Aged Out   HPV VACCINES  Aged Out     Discussed health benefits of physical activity, and encouraged him to engage in regular exercise appropriate for his age and condition.    Problem List Items Addressed This Visit       Cardiovascular and Mediastinum   Hypertension    - Chronic and poorly-controlled - Start HCTZ - Continue Amlodipine, Lisinopril, & Metoprolol - Counseled pt. to continue regularly checking BP at home - Recheck CMP - Return to clinic in 2 months for HTN follow-up        Other   Hyperlipidemia    - Chronic, previously well-controlled - Recheck lipid panel & CMP - Continue Atorvastatin      Depression    - Chronic and well-controlled - Continue Zoloft      GAD (generalized anxiety disorder)    - Chronic and well-controlled - Continue Zoloft      Other Visit Diagnoses     Encounter for annual physical exam    -  Primary   - Overall doing well - Ordered Cologuard for routine colon cancer screening - Flu & shingles vaccines delivered in office today - Counseled pt. to receive new COVID booster this fall    Need for influenza vaccination       Relevant Orders   Flu Vaccine QUAD 75moIM (Fluarix, Fluzone & Alfiuria Quad PF)   Hyperglycemia       - Mild, intermittent episodes of hyperglycemia without a history of  diabetes - Check A1c        Return in about 2 months (around 05/26/2021) for BP f/u, shingrix #2.     MPercell Locus MS3   Patient seen along with MS3 student MPercell Locus I personally evaluated this patient along with the student, and verified all aspects of the history, physical exam, and medical decision making as documented by the student. I agree with the student's documentation and have made all necessary edits.  Dakari Cregger, ADionne Bucy MD, MPH [facility]

## 2021-03-27 LAB — COMPREHENSIVE METABOLIC PANEL
ALT: 22 IU/L (ref 0–44)
AST: 24 IU/L (ref 0–40)
Albumin/Globulin Ratio: 2.2 (ref 1.2–2.2)
Albumin: 4.7 g/dL (ref 3.8–4.9)
Alkaline Phosphatase: 114 IU/L (ref 44–121)
BUN/Creatinine Ratio: 18 (ref 9–20)
BUN: 15 mg/dL (ref 6–24)
Bilirubin Total: 0.3 mg/dL (ref 0.0–1.2)
CO2: 23 mmol/L (ref 20–29)
Calcium: 9.2 mg/dL (ref 8.7–10.2)
Chloride: 103 mmol/L (ref 96–106)
Creatinine, Ser: 0.85 mg/dL (ref 0.76–1.27)
Globulin, Total: 2.1 g/dL (ref 1.5–4.5)
Glucose: 98 mg/dL (ref 65–99)
Potassium: 4 mmol/L (ref 3.5–5.2)
Sodium: 141 mmol/L (ref 134–144)
Total Protein: 6.8 g/dL (ref 6.0–8.5)
eGFR: 101 mL/min/{1.73_m2} (ref >59)

## 2021-03-27 LAB — HEMOGLOBIN A1C
Est. average glucose Bld gHb Est-mCnc: 103 mg/dL
Hgb A1c MFr Bld: 5.2 % (ref 4.8–5.6)

## 2021-03-27 LAB — LIPID PANEL
Chol/HDL Ratio: 3.4 ratio (ref 0.0–5.0)
Cholesterol, Total: 159 mg/dL (ref 100–199)
HDL: 47 mg/dL (ref >39)
LDL Chol Calc (NIH): 87 mg/dL (ref 0–99)
Triglycerides: 141 mg/dL (ref 0–149)
VLDL Cholesterol Cal: 25 mg/dL (ref 5–40)

## 2021-04-11 LAB — COLOGUARD: Cologuard: NEGATIVE

## 2021-04-16 ENCOUNTER — Telehealth: Payer: Self-pay

## 2021-04-16 MED ORDER — ATORVASTATIN CALCIUM 40 MG PO TABS: ORAL_TABLET | ORAL | 3 refills | Status: DC

## 2021-04-16 NOTE — Telephone Encounter (Signed)
[facility] faxed refill request for the following medications:  atorvastatin (LIPITOR) 40 MG tablet   Please advise.  

## 2021-05-24 ENCOUNTER — Telehealth: Payer: Self-pay | Admitting: Family Medicine

## 2021-05-24 DIAGNOSIS — I10 Essential (primary) hypertension: Secondary | ICD-10-CM

## 2021-05-24 MED ORDER — METOPROLOL TARTRATE 25 MG PO TABS: ORAL_TABLET | ORAL | 1 refills | Status: DC

## 2021-05-24 NOTE — Telephone Encounter (Signed)
[facility] faxed refill request for the following medications:   metoprolol tartrate (LOPRESSOR) 25 MG tablet    Please advise.  

## 2021-06-05 ENCOUNTER — Other Ambulatory Visit: Payer: Self-pay | Admitting: Family Medicine

## 2021-06-22 ENCOUNTER — Ambulatory Visit: Payer: 59 | Admitting: Family Medicine

## 2021-07-02 NOTE — Progress Notes (Signed)
Established patient visit   Patient: Roy Little   DOB: 16-Jun-1966   55 y.o. Male  MRN: [id number] Visit Date: 07/03/2021  Today's healthcare provider: Lavon Paganini, MD   Chief Complaint  Patient presents with   Hypertension   I,Sulibeya S Dimas,acting as a scribe for Lavon Paganini, MD.,have documented all relevant documentation on the behalf of Lavon Paganini, MD,as directed by  Lavon Paganini, MD while in the presence of Lavon Paganini, MD.  Subjective    HPI  Hypertension, follow-up  BP Readings from Last 3 Encounters:  07/03/21 (!) 145/90  03/26/21 (!) 156/92  10/06/20 116/72   Wt Readings from Last 3 Encounters:  07/03/21 180 lb 12.8 oz (82 kg)  03/26/21 184 lb 6.4 oz (83.6 kg)  10/06/20 187 lb 14.4 oz (85.2 kg)     He was last seen for hypertension 3 months ago.  BP at that visit was 156/92. Management since that visit includes Start HCTZ - Continue Amlodipine, Lisinopril, & Metoprolol.  He reports excellent compliance with treatment. He is not having side effects.  He is following a Regular, Low Sodium diet. He is exercising. He does not smoke.  Use of agents associated with hypertension: none.   Outside blood pressures are elevated 144/99 average. Symptoms: No chest pain No chest pressure  No palpitations No syncope  No dyspnea No orthopnea  No paroxysmal nocturnal dyspnea No lower extremity edema   Pertinent labs: Lab Results  Component Value Date   CHOL 159 03/26/2021   HDL 47 03/26/2021   LDLCALC 87 03/26/2021   TRIG 141 03/26/2021   CHOLHDL 3.4 03/26/2021   Lab Results  Component Value Date   NA 141 03/26/2021   K 4.0 03/26/2021   CREATININE 0.85 03/26/2021   EGFR 101 03/26/2021   GLUCOSE 98 03/26/2021   TSH 1.720 07/21/2019     The 10-year ASCVD risk score (Arnett DK, et al., 2019) is: 8%   ---------------------------------------------------------------------------------------------------      Medications: Outpatient Medications Prior to Visit  Medication Sig   amLODipine (NORVASC) 10 MG tablet TAKE 1 TABLET(10 MG) BY MOUTH DAILY   atorvastatin (LIPITOR) 40 MG tablet TAKE 1 TABLET(40 MG) BY MOUTH AT BEDTIME   finasteride (PROSCAR) 5 MG tablet TAKE 1 TABLET(5 MG) BY MOUTH DAILY   sertraline (ZOLOFT) 100 MG tablet TAKE 1 TABLET(100 MG) BY MOUTH DAILY   [DISCONTINUED] hydrochlorothiazide (HYDRODIURIL) 12.5 MG tablet Take 1 tablet (12.5 mg total) by mouth daily.   [DISCONTINUED] lisinopril (ZESTRIL) 40 MG tablet TAKE 1 TABLET(40 MG) BY MOUTH DAILY   metoprolol tartrate (LOPRESSOR) 25 MG tablet TAKE 1 TABLET(25 MG) BY MOUTH TWICE DAILY (Patient not taking: Reported on 07/03/2021)   No facility-administered medications prior to visit.    Review of Systems  Constitutional:  Negative for activity change, appetite change, fatigue and fever.  HENT:         Change in taste s/p COVID infection  Eyes: Negative.   Respiratory:  Negative for chest tightness and shortness of breath.   Cardiovascular:  Negative for chest pain and leg swelling.  Gastrointestinal: Negative.   Endocrine: Negative.   Genitourinary: Negative.   Musculoskeletal: Negative.   Skin: Negative.   Psychiatric/Behavioral: Negative.         Objective    BP (!) 145/90 Comment: home readings   Pulse (!) 101    Temp 98.7 F (37.1 C) (Oral)    Resp 16    Wt 180 lb 12.8 oz (  82 kg)    SpO2 98%    BMI 26.70 kg/m  BP Readings from Last 3 Encounters:  07/03/21 (!) 145/90  03/26/21 (!) 156/92  10/06/20 116/72   Wt Readings from Last 3 Encounters:  07/03/21 180 lb 12.8 oz (82 kg)  03/26/21 184 lb 6.4 oz (83.6 kg)  10/06/20 187 lb 14.4 oz (85.2 kg)      Physical Exam Vitals reviewed.  Constitutional:      General: He is not in acute distress.    Appearance: Normal appearance. He is not diaphoretic.  HENT:     Head: Normocephalic and atraumatic.  Eyes:     General: No scleral icterus.     Conjunctiva/sclera: Conjunctivae normal.  Cardiovascular:     Rate and Rhythm: Normal rate and regular rhythm.     Pulses: Normal pulses.     Heart sounds: Normal heart sounds. No murmur heard. Pulmonary:     Effort: Pulmonary effort is normal. No respiratory distress.     Breath sounds: Normal breath sounds. No wheezing or rhonchi.  Abdominal:     General: There is no distension.     Palpations: Abdomen is soft.     Tenderness: There is no abdominal tenderness.  Musculoskeletal:     Cervical back: Neck supple.     Right lower leg: No edema.     Left lower leg: No edema.  Lymphadenopathy:     Cervical: No cervical adenopathy.  Skin:    General: Skin is warm and dry.     Capillary Refill: Capillary refill takes less than 2 seconds.     Findings: No rash.  Neurological:     Mental Status: He is alert and oriented to person, place, and time.     Cranial Nerves: No cranial nerve deficit.  Psychiatric:        Mood and Affect: Mood normal.        Behavior: Behavior normal.      No results found for any visits on 07/03/21.  Assessment & Plan     Problem List Items Addressed This Visit       Cardiovascular and Mediastinum   Hypertension - Primary    Remains uncontrolled Needs to resume metoprolol  continue amlodipine and lisinopril at current dose Increase HCTZ to $Remov'25mg'qsyXVZ$  daily Recheck BMP      Relevant Medications   hydrochlorothiazide (HYDRODIURIL) 25 MG tablet   lisinopril (ZESTRIL) 40 MG tablet   Other Relevant Orders   Basic Metabolic Panel (BMET)   Other Visit Diagnoses     Need for shingles vaccine       Relevant Orders   Varicella-zoster vaccine IM (Completed)        Return in about 2 months (around 09/03/2021) for BP f/u.      I, Lavon Paganini, MD, have reviewed all documentation for this visit. The documentation on 07/03/21 for the exam, diagnosis, procedures, and orders are all accurate and complete.   Ahmia Colford, Dionne Bucy, MD, MPH [facility]

## 2021-07-03 ENCOUNTER — Ambulatory Visit (INDEPENDENT_AMBULATORY_CARE_PROVIDER_SITE_OTHER): Payer: 59 | Admitting: Family Medicine

## 2021-07-03 ENCOUNTER — Other Ambulatory Visit: Payer: Self-pay

## 2021-07-03 ENCOUNTER — Encounter: Payer: Self-pay | Admitting: Family Medicine

## 2021-07-03 VITALS — BP 145/90 | HR 101 | Temp 98.7°F | Resp 16 | Wt 180.8 lb

## 2021-07-03 DIAGNOSIS — Z23 Encounter for immunization: Secondary | ICD-10-CM | POA: Diagnosis not present

## 2021-07-03 DIAGNOSIS — I10 Essential (primary) hypertension: Secondary | ICD-10-CM | POA: Diagnosis not present

## 2021-07-03 MED ORDER — LISINOPRIL 40 MG PO TABS: ORAL_TABLET | ORAL | 1 refills | Status: DC

## 2021-07-03 MED ORDER — HYDROCHLOROTHIAZIDE 25 MG PO TABS
25.0000 mg | ORAL_TABLET | Freq: Every day | ORAL | 1 refills | Status: DC
Start: 2021-07-03 — End: 2021-12-27

## 2021-07-03 NOTE — Assessment & Plan Note (Addendum)
Remains uncontrolled Needs to resume metoprolol  continue amlodipine and lisinopril at current dose Increase HCTZ to 25mg  daily Recheck BMP

## 2021-07-04 LAB — BASIC METABOLIC PANEL
BUN/Creatinine Ratio: 13 (ref 9–20)
BUN: 15 mg/dL (ref 6–24)
CO2: 25 mmol/L (ref 20–29)
Calcium: 9.8 mg/dL (ref 8.7–10.2)
Chloride: 101 mmol/L (ref 96–106)
Creatinine, Ser: 1.15 mg/dL (ref 0.76–1.27)
Glucose: 90 mg/dL (ref 70–99)
Potassium: 4.1 mmol/L (ref 3.5–5.2)
Sodium: 143 mmol/L (ref 134–144)
eGFR: 74 mL/min/{1.73_m2} (ref >59)

## 2021-07-25 ENCOUNTER — Other Ambulatory Visit: Payer: Self-pay | Admitting: Family Medicine

## 2021-07-25 NOTE — Telephone Encounter (Signed)
Requested medications are due for refill today.  no  Requested medications are on the active medications list.  no  Last refill. 03/26/2021  Future visit scheduled.   yes  Notes to clinic.  Dosage was changed on 12/202/2022 to 25 mg.    Requested Prescriptions  Pending Prescriptions Disp Refills   hydrochlorothiazide (HYDRODIURIL) 12.5 MG tablet [Pharmacy Med Name: HYDROCHLOROTHIAZIDE 12.5MG  TABLETS] 30 tablet 3    Sig: TAKE 1 TABLET(12.5 MG) BY MOUTH DAILY     Cardiovascular: Diuretics - Thiazide Failed - 07/25/2021  3:36 AM      Failed - Last BP in normal range    BP Readings from Last 1 Encounters:  07/03/21 (!) 145/90          Passed - Ca in normal range and within 360 days    Calcium  Date Value Ref Range Status  07/03/2021 9.8 8.7 - 10.2 mg/dL Final          Passed - Cr in normal range and within 360 days    Creatinine, Ser  Date Value Ref Range Status  07/03/2021 1.15 0.76 - 1.27 mg/dL Final          Passed - K in normal range and within 360 days    Potassium  Date Value Ref Range Status  07/03/2021 4.1 3.5 - 5.2 mmol/L Final          Passed - Na in normal range and within 360 days    Sodium  Date Value Ref Range Status  07/03/2021 143 134 - 144 mmol/L Final          Passed - Valid encounter within last 6 months    Recent Outpatient Visits           3 weeks ago Primary hypertension   [facility] [place], Dionne Bucy, MD   4 months ago Encounter for annual physical exam   [facility] [place], Dionne Bucy, MD   9 months ago Primary hypertension   [facility] [place], Dionne Bucy, MD   1 year ago Hypertension, unspecified type   [facility] [place], Wendee Beavers, [place]   2 years ago Annual physical exam   [place], Wendee Beavers, [place]       Future Appointments             In 1 month [name], Dionne Bucy, MD [facility], [place]

## 2021-08-22 ENCOUNTER — Other Ambulatory Visit: Payer: Self-pay | Admitting: Family Medicine

## 2021-08-22 DIAGNOSIS — F32A Depression, unspecified: Secondary | ICD-10-CM

## 2021-08-22 DIAGNOSIS — F419 Anxiety disorder, unspecified: Secondary | ICD-10-CM

## 2021-08-22 NOTE — Telephone Encounter (Signed)
Requested medication (s) are due for refill today:   Provider to review  Requested medication (s) are on the active medication list:   Yes  Future visit scheduled:   Yes   Last ordered: 02/27/2021 #90, 1 refill  Returned because this is a non delegated refill under revised protocols.     Requested Prescriptions  Pending Prescriptions Disp Refills   sertraline (ZOLOFT) 100 MG tablet [Pharmacy Med Name: SERTRALINE 100MG  TABLETS] 90 tablet 1    Sig: TAKE 1 TABLET(100 MG) BY MOUTH DAILY     Not Delegated - Psychiatry:  Antidepressants - SSRI - sertraline Failed - 08/22/2021  7:52 AM      Failed - This refill cannot be delegated      Passed - AST in normal range and within 360 days    AST  Date Value Ref Range Status  03/26/2021 24 0 - 40 IU/L Final          Passed - ALT in normal range and within 360 days    ALT  Date Value Ref Range Status  03/26/2021 22 0 - 44 IU/L Final          Passed - Completed PHQ-2 or PHQ-9 in the last 360 days      Passed - Valid encounter within last 6 months    Recent Outpatient Visits           1 month ago Primary hypertension   [place] [name], Dionne Bucy, MD   4 months ago Encounter for annual physical exam   [facility] [place], Dionne Bucy, MD   10 months ago Primary hypertension   [facility] [place], Dionne Bucy, MD   1 year ago Hypertension, unspecified type   [facility] [place], Wendee Beavers, [place]   2 years ago Annual physical exam   [facility] [place], Wendee Beavers, [place]       Future Appointments             In 2 weeks [name], Dionne Bucy, MD [facility], [place]

## 2021-09-06 ENCOUNTER — Ambulatory Visit: Payer: 59 | Admitting: Family Medicine

## 2021-09-06 ENCOUNTER — Other Ambulatory Visit: Payer: Self-pay

## 2021-09-06 ENCOUNTER — Encounter: Payer: Self-pay | Admitting: Family Medicine

## 2021-09-06 VITALS — BP 111/71 | HR 69 | Temp 97.7°F | Resp 16 | Wt 188.4 lb

## 2021-09-06 DIAGNOSIS — F321 Major depressive disorder, single episode, moderate: Secondary | ICD-10-CM | POA: Insufficient documentation

## 2021-09-06 DIAGNOSIS — F329 Major depressive disorder, single episode, unspecified: Secondary | ICD-10-CM | POA: Insufficient documentation

## 2021-09-06 DIAGNOSIS — I10 Essential (primary) hypertension: Secondary | ICD-10-CM

## 2021-09-06 DIAGNOSIS — F325 Major depressive disorder, single episode, in full remission: Secondary | ICD-10-CM | POA: Insufficient documentation

## 2021-09-06 NOTE — Assessment & Plan Note (Signed)
Well controlled Continue current medications Recheck metabolic panel F/u in 6 months  

## 2021-09-06 NOTE — Progress Notes (Signed)
Established patient visit   Patient: Roy Little   DOB: 1966/05/11   56 y.o. Male  MRN: [id number] Visit Date: 09/06/2021  Today's healthcare provider: Lavon Paganini, MD   Chief Complaint  Patient presents with   Hypertension   I,Sulibeya S Dimas,acting as a scribe for Lavon Paganini, MD.,have documented all relevant documentation on the behalf of Lavon Paganini, MD,as directed by  Lavon Paganini, MD while in the presence of Lavon Paganini, MD.  Subjective    HPI  Hypertension, follow-up  BP Readings from Last 3 Encounters:  09/06/21 111/71  07/03/21 (!) 145/90  03/26/21 (!) 156/92   Wt Readings from Last 3 Encounters:  09/06/21 188 lb 6.4 oz (85.5 kg)  07/03/21 180 lb 12.8 oz (82 kg)  03/26/21 184 lb 6.4 oz (83.6 kg)     He was last seen for hypertension 2 months ago.  BP at that visit was 145/90. Management since that visit includes; Remains uncontrolled. Needs to resume metoprolol, continue amlodipine and lisinopril at current dose. Increase HCTZ to $Remov'25mg'tEfLbA$  daily  He reports excellent compliance with treatment. He is not having side effects.  He is following a Regular diet. He is exercising. He does not smoke.   Outside blood pressures are stable, low. 106/70s  Pertinent labs: Lab Results  Component Value Date   CHOL 159 03/26/2021   HDL 47 03/26/2021   LDLCALC 87 03/26/2021   TRIG 141 03/26/2021   CHOLHDL 3.4 03/26/2021   Lab Results  Component Value Date   NA 143 07/03/2021   K 4.1 07/03/2021   CREATININE 1.15 07/03/2021   EGFR 74 07/03/2021   GLUCOSE 90 07/03/2021   TSH 1.720 07/21/2019     The 10-year ASCVD risk score (Arnett DK, et al., 2019) is: 5%   ---------------------------------------------------------------------------------------------------  Medications: Outpatient Medications Prior to Visit  Medication Sig   amLODipine (NORVASC) 10 MG tablet TAKE 1 TABLET(10 MG) BY MOUTH DAILY   atorvastatin (LIPITOR) 40 MG  tablet TAKE 1 TABLET(40 MG) BY MOUTH AT BEDTIME   finasteride (PROSCAR) 5 MG tablet TAKE 1 TABLET(5 MG) BY MOUTH DAILY   hydrochlorothiazide (HYDRODIURIL) 25 MG tablet Take 1 tablet (25 mg total) by mouth daily.   lisinopril (ZESTRIL) 40 MG tablet TAKE 1 TABLET(40 MG) BY MOUTH DAILY   metoprolol tartrate (LOPRESSOR) 25 MG tablet TAKE 1 TABLET(25 MG) BY MOUTH TWICE DAILY   sertraline (ZOLOFT) 100 MG tablet TAKE 1 TABLET(100 MG) BY MOUTH DAILY   No facility-administered medications prior to visit.    Review of Systems per HPI     Objective    BP 111/71 (BP Location: Left Arm, Patient Position: Sitting, Cuff Size: Large)    Pulse 69    Temp 97.7 F (36.5 C) (Temporal)    Resp 16    Wt 188 lb 6.4 oz (85.5 kg)    BMI 27.82 kg/m    Physical Exam Vitals reviewed.  Constitutional:      General: He is not in acute distress.    Appearance: Normal appearance. He is not diaphoretic.  HENT:     Head: Normocephalic and atraumatic.  Eyes:     General: No scleral icterus.    Conjunctiva/sclera: Conjunctivae normal.  Cardiovascular:     Rate and Rhythm: Normal rate and regular rhythm.     Pulses: Normal pulses.     Heart sounds: Normal heart sounds. No murmur heard. Pulmonary:     Effort: Pulmonary effort is normal. No respiratory distress.  Breath sounds: Normal breath sounds. No wheezing or rhonchi.  Musculoskeletal:     Cervical back: Neck supple.     Right lower leg: No edema.     Left lower leg: No edema.  Lymphadenopathy:     Cervical: No cervical adenopathy.  Skin:    General: Skin is warm and dry.     Capillary Refill: Capillary refill takes less than 2 seconds.     Findings: No rash.  Neurological:     Mental Status: He is alert and oriented to person, place, and time.  Psychiatric:        Mood and Affect: Mood normal.        Behavior: Behavior normal.      No results found for any visits on 09/06/21.  Assessment & Plan     Problem List Items Addressed This Visit        Cardiovascular and Mediastinum   Hypertension - Primary    Well controlled Continue current medications Recheck metabolic panel F/u in 6 months       Relevant Orders   Basic Metabolic Panel (BMET)     Return in about 7 months (around 04/06/2022) for CPE.      I, Lavon Paganini, MD, have reviewed all documentation for this visit. The documentation on 09/06/21 for the exam, diagnosis, procedures, and orders are all accurate and complete.   Kierstyn Baranowski, Dionne Bucy, MD, MPH [facility]

## 2021-09-07 LAB — BASIC METABOLIC PANEL
BUN/Creatinine Ratio: 18 (ref 9–20)
BUN: 21 mg/dL (ref 6–24)
CO2: 25 mmol/L (ref 20–29)
Calcium: 9.1 mg/dL (ref 8.7–10.2)
Chloride: 101 mmol/L (ref 96–106)
Creatinine, Ser: 1.19 mg/dL (ref 0.76–1.27)
Glucose: 91 mg/dL (ref 70–99)
Potassium: 4 mmol/L (ref 3.5–5.2)
Sodium: 138 mmol/L (ref 134–144)
eGFR: 71 mL/min/{1.73_m2} (ref >59)

## 2021-09-17 ENCOUNTER — Telehealth: Payer: Self-pay | Admitting: Family Medicine

## 2021-09-17 DIAGNOSIS — I10 Essential (primary) hypertension: Secondary | ICD-10-CM

## 2021-09-17 MED ORDER — AMLODIPINE BESYLATE 10 MG PO TABS: ORAL_TABLET | ORAL | 1 refills | Status: DC

## 2021-09-17 NOTE — Telephone Encounter (Signed)
[facility] faxed refill request for the following medications: ° °amLODipine (NORVASC) 10 MG tablet  ° ° °Please advise. °

## 2021-11-01 ENCOUNTER — Telehealth: Payer: Self-pay | Admitting: Family Medicine

## 2021-11-01 MED ORDER — ATORVASTATIN CALCIUM 40 MG PO TABS: ORAL_TABLET | ORAL | 1 refills | Status: DC

## 2021-11-01 NOTE — Telephone Encounter (Signed)
[facility] faxed refill request for the following medications:  atorvastatin (LIPITOR) 40 MG tablet   Please advise.  

## 2021-11-19 ENCOUNTER — Telehealth: Payer: Self-pay | Admitting: Family Medicine

## 2021-11-19 DIAGNOSIS — I10 Essential (primary) hypertension: Secondary | ICD-10-CM

## 2021-11-19 NOTE — Telephone Encounter (Signed)
[facility] faxed refill request for the following medications:   metoprolol tartrate (LOPRESSOR) 25 MG tablet    Please advise.  

## 2021-11-20 MED ORDER — METOPROLOL TARTRATE 25 MG PO TABS: ORAL_TABLET | ORAL | 1 refills | Status: DC

## 2021-11-20 NOTE — Addendum Note (Signed)
Addended by: Shawna Orleans on: 11/20/2021 10:05 AM ? ? Modules accepted: Orders ? ?

## 2021-12-12 ENCOUNTER — Other Ambulatory Visit: Payer: Self-pay | Admitting: Family Medicine

## 2021-12-17 ENCOUNTER — Telehealth: Payer: Self-pay | Admitting: Family Medicine

## 2021-12-17 DIAGNOSIS — I10 Essential (primary) hypertension: Secondary | ICD-10-CM

## 2021-12-17 MED ORDER — AMLODIPINE BESYLATE 10 MG PO TABS: ORAL_TABLET | ORAL | 1 refills | Status: DC

## 2021-12-17 NOTE — Telephone Encounter (Signed)
Ok to refill - please send

## 2021-12-17 NOTE — Telephone Encounter (Signed)
[facility] faxed refill request for the following medications: ° °amLODipine (NORVASC) 10 MG tablet  ° ° °Please advise. °

## 2021-12-27 ENCOUNTER — Other Ambulatory Visit: Payer: Self-pay | Admitting: Family Medicine

## 2022-02-18 ENCOUNTER — Telehealth: Payer: Self-pay | Admitting: Family Medicine

## 2022-02-18 NOTE — Telephone Encounter (Signed)
[facility] faxed refill request for the following medications:  sertraline (ZOLOFT) 100 MG tablet   Please advise.

## 2022-02-19 ENCOUNTER — Other Ambulatory Visit: Payer: Self-pay | Admitting: Family Medicine

## 2022-02-19 DIAGNOSIS — F32A Depression, unspecified: Secondary | ICD-10-CM

## 2022-02-19 MED ORDER — SERTRALINE HCL 100 MG PO TABS: ORAL_TABLET | ORAL | 1 refills | Status: DC

## 2022-04-04 ENCOUNTER — Encounter: Payer: 59 | Admitting: Family Medicine

## 2022-04-08 ENCOUNTER — Encounter: Payer: Self-pay | Admitting: Family Medicine

## 2022-04-08 ENCOUNTER — Ambulatory Visit (INDEPENDENT_AMBULATORY_CARE_PROVIDER_SITE_OTHER): Payer: 59 | Admitting: Family Medicine

## 2022-04-08 ENCOUNTER — Other Ambulatory Visit: Payer: Self-pay

## 2022-04-08 VITALS — BP 135/78 | HR 71 | Resp 16 | Ht 69.0 in | Wt 187.0 lb

## 2022-04-08 DIAGNOSIS — F411 Generalized anxiety disorder: Secondary | ICD-10-CM

## 2022-04-08 DIAGNOSIS — Z0000 Encounter for general adult medical examination without abnormal findings: Secondary | ICD-10-CM | POA: Diagnosis not present

## 2022-04-08 DIAGNOSIS — I10 Essential (primary) hypertension: Secondary | ICD-10-CM | POA: Diagnosis not present

## 2022-04-08 DIAGNOSIS — E782 Mixed hyperlipidemia: Secondary | ICD-10-CM

## 2022-04-08 DIAGNOSIS — Z23 Encounter for immunization: Secondary | ICD-10-CM | POA: Diagnosis not present

## 2022-04-08 DIAGNOSIS — L659 Nonscarring hair loss, unspecified: Secondary | ICD-10-CM

## 2022-04-08 NOTE — Assessment & Plan Note (Signed)
Controlled on sertraline, continue.

## 2022-04-08 NOTE — Progress Notes (Signed)
BP 135/78 (BP Location: Left Arm, Patient Position: Sitting, Cuff Size: Normal)   Pulse 71   Resp 16   Ht _0  (1.753 m)   Wt 187 lb (84.8 kg)   BMI 27.62 kg/m    Subjective:    Patient ID: Roy Little, male    DOB: [birth year], 56 y.o.   MRN: [id number]  HPI: Roy Little is a 56 y.o. male presenting on 04/08/2022 for comprehensive medical examination. Current medical complaints include:none  Hypertension: - Medications: amlodipine, HCTZ, lisinopril, metoprolol - Compliance: good - Checking BP at home: yes, SBP 120s - Denies any SOB, CP, vision changes, LE edema, medication SEs, or symptoms of hypotension  HLD - medications: atorvastatin - compliance: good - medication SEs: none  Anxiety - Medications: sertraline - Taking: good compliance - Counseling: no - Symptoms: none  Hair loss - on finasteride with good effect. Previously followed by Payton Mccallum.    Depression Screen done today and results listed below:     04/08/2022    8:44 AM 09/06/2021    1:49 PM 07/03/2021    8:14 AM 03/26/2021    2:49 PM 10/06/2020   11:01 AM  Depression screen PHQ 2/9  Decreased Interest 0 0 0 0 0  Down, Depressed, Hopeless 0 0 0 0 0  PHQ - 2 Score 0 0 0 0 0  Altered sleeping 0 0 0 0 1  Tired, decreased energy 1 1 0 0 1  Change in appetite 0 0 0 0 0  Feeling bad or failure about yourself  0 0 0 0 0  Trouble concentrating 0 0 0 0 0  Moving slowly or fidgety/restless 0 0 0 0 0  Suicidal thoughts 0 0 0 0 0  PHQ-9 Score 1 1 0 0 2  Difficult doing work/chores Not difficult at all Not difficult at all Not difficult at all Not difficult at all Not difficult at all     Past Medical History:  Past Medical History:  Diagnosis Date   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[place])    Cholecystitis, acute with cholelithiasis 06/06/2017   Choledocholithiasis    Common bile duct leak    Depression    Fitting and adjustment of gastrointestinal appliance and device    GERD (gastroesophageal reflux  disease)    Hypertension    Jaundice     Surgical History:  Past Surgical History:  Procedure Laterality Date   CHOLECYSTECTOMY N/A 06/06/2017   Procedure: LAPAROSCOPIC CHOLECYSTECTOMY WITH INTRAOPERATIVE CHOLANGIOGRAM;  Surgeon: Jules Husbands, MD;  Location: [facility] ORS;  Service: General;  Laterality: N/A;   ERCP N/A 06/10/2017   Procedure: ENDOSCOPIC RETROGRADE CHOLANGIOPANCREATOGRAPHY (ERCP);  Surgeon: Lucilla Lame, MD;  Location: [facility] ENDOSCOPY;  Service: Endoscopy;  Laterality: N/A;   ERCP N/A 09/02/2017   Procedure: ENDOSCOPIC RETROGRADE CHOLANGIOPANCREATOGRAPHY (ERCP);  Surgeon: Lucilla Lame, MD;  [facility] END[facility];  Service: Endoscopy;  Laterality: N/A;   KNEE SURGERY      Medications:  Current Outpatient Medications on File Prior to Visit  Medication Sig   amLODipine (NORVASC) 10 MG tablet TAKE 1 TABLET(10 MG) BY MOUTH DAILY   atorvastatin (LIPITOR) 40 MG tablet TAKE 1 TABLET(40 MG) BY MOUTH AT BEDTIME   finasteride (PROSCAR) 5 MG tablet TAKE 1 TABLET(5 MG) BY MOUTH DAILY   hydrochlorothiazide (HYDRODIURIL) 25 MG tablet TAKE 1 TABLET(25 MG) BY MOUTH DAILY   lisinopril (ZESTRIL) 40 MG tablet TAKE 1 TABLET(40 MG) BY MOUTH DAILY   metoprolol tartrate (LOPRESSOR) 25 MG tablet  TAKE 1 TABLET(25 MG) BY MOUTH TWICE DAILY   sertraline (ZOLOFT) 100 MG tablet Take 1/2 tablet, 50 mg, PO, once daily for 2 weeks. Resume 1 tablet, 100 mg, PO, after initial titration/restart. Keep upcoming appts.   No current facility-administered medications on file prior to visit.    Allergies:  No Known Allergies  Social History:  Social History   Socioeconomic History   Marital status: Married    Spouse name: Not on file   Number of children: Not on file   Years of education: Not on file   Highest education level: Not on file  Occupational History   Not on file  Tobacco Use   Smoking status: Never   Smokeless tobacco: Never  Vaping Use   Vaping Use: Never used  Substance and Sexual  Activity   Alcohol use: Yes    Comment: Very Rarely   Drug use: No   Sexual activity: Yes  Other Topics Concern   Not on file  Social History Narrative   Not on file   Social Determinants of Health   Financial Resource Strain: Not on file  Food Insecurity: Not on file  Transportation Needs: Not on file  Physical Activity: Not on file  Stress: Not on file  Social Connections: Not on file  Intimate Partner Violence: Not on file   Social History   Tobacco Use  Smoking Status Never  Smokeless Tobacco Never   Social History   Substance and Sexual Activity  Alcohol Use Yes   Comment: Very Rarely    Family History:  Family History  Problem Relation Age of Onset   Hypertension Mother    COPD Mother    Pancreatic cancer Father    COPD Maternal Grandmother    Congestive Heart Failure Maternal Grandfather     Past medical history, surgical history, medications, allergies, family history and social history reviewed with patient today and changes made to appropriate areas of the chart.      Objective:    BP 135/78 (BP Location: Left Arm, Patient Position: Sitting, Cuff Size: Normal)   Pulse 71   Resp 16   Ht _0  (1.753 m)   Wt 187 lb (84.8 kg)   BMI 27.62 kg/m   Wt Readings from Last 3 Encounters:  04/08/22 187 lb (84.8 kg)  09/06/21 188 lb 6.4 oz (85.5 kg)  07/03/21 180 lb 12.8 oz (82 kg)    Physical Exam Vitals reviewed.  Constitutional:      Appearance: Normal appearance.  HENT:     Head: Normocephalic.     Right Ear: Tympanic membrane, ear canal and external ear normal.     Left Ear: Tympanic membrane, ear canal and external ear normal.     Nose: Nose normal.     Mouth/Throat:     Mouth: Mucous membranes are moist.     Pharynx: Oropharynx is clear.  Eyes:     Extraocular Movements: Extraocular movements intact.     Pupils: Pupils are equal, round, and reactive to light.  Cardiovascular:     Rate and Rhythm: Normal rate and regular rhythm.      Heart sounds: Normal heart sounds.  Pulmonary:     Effort: Pulmonary effort is normal.     Breath sounds: Normal breath sounds.  Abdominal:     General: Bowel sounds are normal.     Palpations: Abdomen is soft.     Tenderness: There is no abdominal tenderness.  Musculoskeletal:  General: Normal range of motion.     Right lower leg: No edema.     Left lower leg: No edema.  Skin:    General: Skin is warm and dry.  Neurological:     Mental Status: He is alert and oriented to person, place, and time. Mental status is at baseline.     Gait: Gait normal.  Psychiatric:        Mood and Affect: Mood normal.        Behavior: Behavior normal.     Results for orders placed or performed in visit on 93/73/42  Basic Metabolic Panel (BMET)  Result Value Ref Range   Glucose 91 70 - 99 mg/dL   BUN 21 6 - 24 mg/dL   Creatinine, Ser 1.19 0.76 - 1.27 mg/dL   eGFR 71 >59 mL/min/1.73   BUN/Creatinine Ratio 18 9 - 20   Sodium 138 134 - 144 mmol/L   Potassium 4.0 3.5 - 5.2 mmol/L   Chloride 101 96 - 106 mmol/L   CO2 25 20 - 29 mmol/L   Calcium 9.1 8.7 - 10.2 mg/dL      Assessment & Plan:   Problem List Items Addressed This Visit       Cardiovascular and Mediastinum   Hypertension    At goal. No changes to meds. Obtaining labs.         Other   GAD (generalized anxiety disorder)    Controlled on sertraline, continue.      Hair loss    Doing well with finasteride, continue.      Hyperlipidemia    Tolerant of statin, continue. Recheck labs.       Other Visit Diagnoses     Annual physical exam    -  Primary   Relevant Orders   Comprehensive metabolic panel   Lipid panel   PSA   Need for influenza vaccination       Relevant Orders   Flu Vaccine QUAD 6+ mos PF IM (Fluarix Quad PF) (Completed)        LABORATORY TESTING:  Health maintenance labs ordered today as discussed above.   The natural history of prostate cancer and ongoing controversy regarding screening  and potential treatment outcomes of prostate cancer has been discussed with the patient. The meaning of a false positive PSA and a false negative PSA has been discussed. He indicates understanding of the limitations of this screening test and wishes to proceed with screening PSA testing.   IMMUNIZATIONS:   - Tdap: Tetanus vaccination status reviewed: last tetanus booster within 10 years. - Influenza: Administered today - Pneumococcal: Not applicable - HPV: Not applicable - Shingrix vaccine: Up to date - COVID vaccine: UTD  SCREENING: - Colonoscopy: Up to date with Cologuard Discussed with patient purpose of the colonoscopy is to detect colon cancer at curable precancerous or early stages   - AAA Screening: Not applicable  - Lung cancer screening: n/a  Hep C Screening: UTD  PATIENT COUNSELING:    Sexuality: Discussed sexually transmitted diseases, partner selection, use of condoms, avoidance of unintended pregnancy  and contraceptive alternatives.   Advised to avoid cigarette smoking.  I discussed with the patient that most people either abstain from alcohol or drink within safe limits (<=14/week and <=4 drinks/occasion for males, <=7/weeks and <= 3 drinks/occasion for females) and that the risk for alcohol disorders and other health effects rises proportionally with the number of drinks per week and how often a drinker exceeds daily limits.  Discussed cessation/primary prevention of drug use and availability of treatment for abuse.   Diet: Encouraged to adjust caloric intake to maintain  or achieve ideal body weight, to reduce intake of dietary saturated fat and total fat, to limit sodium intake by avoiding high sodium foods and not adding table salt, and to maintain adequate dietary potassium and calcium preferably from fresh fruits, vegetables, and low-fat dairy products.    stressed the importance of regular exercise  Injury prevention: Discussed safety belts, safety helmets,  smoke detector, smoking near bedding or upholstery.   Dental health: Discussed importance of regular tooth brushing, flossing, and dental visits.   Follow up plan: NEXT PREVENTATIVE PHYSICAL DUE IN 1 YEAR. Return in about 6 months (around 10/07/2022) for chronic dz f/u.

## 2022-04-08 NOTE — Assessment & Plan Note (Signed)
At goal. No changes to meds. Obtaining labs.

## 2022-04-08 NOTE — Patient Instructions (Signed)

## 2022-04-08 NOTE — Assessment & Plan Note (Signed)
Doing well with finasteride, continue.

## 2022-04-08 NOTE — Assessment & Plan Note (Signed)
Tolerant of statin, continue. Recheck labs. 

## 2022-04-09 LAB — LIPID PANEL
Chol/HDL Ratio: 4.9 ratio (ref 0.0–5.0)
Cholesterol, Total: 182 mg/dL (ref 100–199)
HDL: 37 mg/dL — ABNORMAL LOW (ref >39)
LDL Chol Calc (NIH): 86 mg/dL (ref 0–99)
Triglycerides: 362 mg/dL — ABNORMAL HIGH (ref 0–149)
VLDL Cholesterol Cal: 59 mg/dL — ABNORMAL HIGH (ref 5–40)

## 2022-04-09 LAB — COMPREHENSIVE METABOLIC PANEL
ALT: 18 IU/L (ref 0–44)
AST: 21 IU/L (ref 0–40)
Albumin/Globulin Ratio: 2.1 (ref 1.2–2.2)
Albumin: 4 g/dL (ref 3.8–4.9)
Alkaline Phosphatase: 98 IU/L (ref 44–121)
BUN/Creatinine Ratio: 21 — ABNORMAL HIGH (ref 9–20)
BUN: 23 mg/dL (ref 6–24)
Bilirubin Total: 0.3 mg/dL (ref 0.0–1.2)
CO2: 23 mmol/L (ref 20–29)
Calcium: 9.1 mg/dL (ref 8.7–10.2)
Chloride: 100 mmol/L (ref 96–106)
Creatinine, Ser: 1.11 mg/dL (ref 0.76–1.27)
Globulin, Total: 1.9 g/dL (ref 1.5–4.5)
Glucose: 102 mg/dL — ABNORMAL HIGH (ref 70–99)
Potassium: 3.9 mmol/L (ref 3.5–5.2)
Sodium: 137 mmol/L (ref 134–144)
Total Protein: 5.9 g/dL — ABNORMAL LOW (ref 6.0–8.5)
eGFR: 77 mL/min/{1.73_m2} (ref >59)

## 2022-04-09 LAB — PSA: Prostate Specific Ag, Serum: 0.3 ng/mL (ref 0.0–4.0)

## 2022-04-16 ENCOUNTER — Ambulatory Visit: Payer: Self-pay

## 2022-04-16 NOTE — Telephone Encounter (Signed)
  Chief Complaint: Lab result questions Symptoms:  Frequency:  Pertinent Negatives: Patient denies  Disposition: '[]'$ ED /'[]'$ Urgent Care (no appt availability in office) / '[]'$ Appointment(In office/virtual)/ '[]'$  [place] [facility]/ '[]'$ Home Care/ '[]'$ Refused Recommended Disposition /'[]'$ [place] [facility]/ X Follow-up with PCP Additional Notes: PT called regarding recent lab results.  Triglyceride values from our labs were much higher than the same labs done 3 weeks earlier by pt at his place of employment ([employer]).  PT will send in via MyChart the results that he has. He would like a call back from provider after they are able to review both lab tests to discuss how these could be so different.   PT has concerns that provider may wish to change pt's medications based on our lab results, and pt wants to make sure these are valid.   Summary: lab level concerns   Pts triglycerides were really high and wanted to check because they went from a normal range to over 300 in about 2-3 weeks on 9.25.23/ please advise      Reason for Disposition  Caller requesting routine or non-urgent lab result  Answer Assessment - Initial Assessment Questions 1. REASON FOR CALL or QUESTION: "What is your reason for calling today?" or "How can I best help you?" or "What question do you have that I can help answer?"     Triglycerides values  very different between lab tests 3 weeks apart.  Protocols used: Information Only Call - No Triage-A-AH, PCP Call - No Triage-A-AH

## 2022-04-17 NOTE — Telephone Encounter (Signed)
Called patient in regards to provider's review of labs. No answer left vm to call back.

## 2022-04-17 NOTE — Telephone Encounter (Signed)
LMOVM for pt to return call. Okay for pec triage to advise patient. Thanks.

## 2022-04-29 NOTE — Telephone Encounter (Signed)
LMOVM for pt to return call. Okay for pec triage to advise patient message below. Thanks.  

## 2022-05-19 ENCOUNTER — Other Ambulatory Visit: Payer: Self-pay | Admitting: Family Medicine

## 2022-05-19 DIAGNOSIS — I10 Essential (primary) hypertension: Secondary | ICD-10-CM

## 2022-06-08 ENCOUNTER — Other Ambulatory Visit: Payer: Self-pay | Admitting: Family Medicine

## 2022-06-19 ENCOUNTER — Other Ambulatory Visit: Payer: Self-pay | Admitting: Family Medicine

## 2022-07-31 ENCOUNTER — Other Ambulatory Visit: Payer: Self-pay | Admitting: Family Medicine

## 2022-08-01 NOTE — Telephone Encounter (Signed)
Requested Prescriptions  Pending Prescriptions Disp Refills   lisinopril (ZESTRIL) 40 MG tablet [Pharmacy Med Name: LISINOPRIL '40MG'$  TABLETS] 90 tablet 0    Sig: TAKE 1 TABLET(40 MG) BY MOUTH DAILY     Cardiovascular:  ACE Inhibitors Passed - 07/31/2022  5:22 PM      Passed - Cr in normal range and within 180 days    Creatinine, Ser  Date Value Ref Range Status  04/08/2022 1.11 0.76 - 1.27 mg/dL Final         Passed - K in normal range and within 180 days    Potassium  Date Value Ref Range Status  04/08/2022 3.9 3.5 - 5.2 mmol/L Final         Passed - Patient is not pregnant      Passed - Last BP in normal range    BP Readings from Last 1 Encounters:  04/08/22 135/78         Passed - Valid encounter within last 6 months    Recent Outpatient Visits           3 months ago Annual physical exam   [facility] Gip, DO   10 months ago Primary hypertension   [facility] [place], Dionne Bucy, MD   1 year ago Primary hypertension   [place], Dionne Bucy, MD   1 year ago Encounter for annual physical exam   [facility] [place], Dionne Bucy, MD   1 year ago Primary hypertension   [facility] [place], Dionne Bucy, MD

## 2022-08-07 ENCOUNTER — Other Ambulatory Visit: Payer: Self-pay | Admitting: Family Medicine

## 2022-08-07 DIAGNOSIS — I10 Essential (primary) hypertension: Secondary | ICD-10-CM

## 2022-10-15 ENCOUNTER — Encounter: Payer: Self-pay | Admitting: Family Medicine

## 2022-10-15 ENCOUNTER — Ambulatory Visit: Payer: 59 | Admitting: Family Medicine

## 2022-10-15 VITALS — BP 116/74 | HR 76 | Temp 98.0°F | Resp 16 | Wt 177.2 lb

## 2022-10-15 DIAGNOSIS — R251 Tremor, unspecified: Secondary | ICD-10-CM | POA: Diagnosis not present

## 2022-10-15 DIAGNOSIS — R413 Other amnesia: Secondary | ICD-10-CM | POA: Diagnosis not present

## 2022-10-15 DIAGNOSIS — R2689 Other abnormalities of gait and mobility: Secondary | ICD-10-CM

## 2022-10-15 NOTE — Progress Notes (Unsigned)
I,Sulibeya S Dimas,acting as a Education administrator for Lavon Paganini, MD.,have documented all relevant documentation on the behalf of Lavon Paganini, MD,as directed by  Lavon Paganini, MD while in the presence of Lavon Paganini, MD.     Established patient visit   Patient: Roy Little   DOB: Apr 18, 1966   57 y.o. Male  MRN: [id number] Visit Date: 10/15/2022  Today's healthcare provider: Lavon Paganini, MD   Chief Complaint  Patient presents with   Memory Loss   Subjective    HPI  Patient C/O memory loss, can't multi task. He reports symptoms have been worsening in the last few weeks.   Feels like he has messed up more in the last 3 weeks than in the last 5 years at work.doesn't remember what he got up to go get. Has lost keys and wallets and phone (ongoing for a year or so)  Balance has been off x1 month but seems to be better this week  Tremors in hands are getting worse.  Had head and neck radiation and chemo previously - in 2011  Has family history of Lewy body dementia and Parkinsons  Forgetting to eat sometimes, esp at work. Weight is down 10 lbs over last 6 months      10/15/2022    1:10 PM  MMSE - Mini Mental State Exam  Orientation to time 5  Orientation to Place 5  Registration 3  Attention/ Calculation 5  Recall 3  Language- name 2 objects 2  Language- repeat 1  Language- follow 3 step command 3  Language- read & follow direction 1  Write a sentence 1  Copy design 1  Total score 30       10/15/2022    1:08 PM 04/08/2022    8:44 AM 09/06/2021    1:49 PM 07/03/2021    8:14 AM 03/26/2021    2:49 PM  Depression screen PHQ 2/9  Decreased Interest 1 0 0 0 0  Down, Depressed, Hopeless 0 0 0 0 0  PHQ - 2 Score 1 0 0 0 0  Altered sleeping 0 0 0 0 0  Tired, decreased energy 1 1 1  0 0  Change in appetite 1 0 0 0 0  Feeling bad or failure about yourself  0 0 0 0 0  Trouble concentrating 2 0 0 0 0  Moving slowly or fidgety/restless 0 0 0 0 0  Suicidal  thoughts 0 0 0 0 0  PHQ-9 Score 5 1 1  0 0  Difficult doing work/chores Somewhat difficult Not difficult at all Not difficult at all Not difficult at all Not difficult at all     Medications: Outpatient Medications Prior to Visit  Medication Sig   amLODipine (NORVASC) 10 MG tablet TAKE 1 TABLET(10 MG) BY MOUTH DAILY   atorvastatin (LIPITOR) 40 MG tablet TAKE 1 TABLET(40 MG) BY MOUTH AT BEDTIME   finasteride (PROSCAR) 5 MG tablet TAKE 1 TABLET(5 MG) BY MOUTH DAILY   hydrochlorothiazide (HYDRODIURIL) 25 MG tablet TAKE 1 TABLET(25 MG) BY MOUTH DAILY   lisinopril (ZESTRIL) 40 MG tablet TAKE 1 TABLET(40 MG) BY MOUTH DAILY   metoprolol tartrate (LOPRESSOR) 25 MG tablet TAKE 1 TABLET(25 MG) BY MOUTH TWICE DAILY   sertraline (ZOLOFT) 100 MG tablet Take 1/2 tablet, 50 mg, PO, once daily for 2 weeks. Resume 1 tablet, 100 mg, PO, after initial titration/restart. Keep upcoming appts.   No facility-administered medications prior to visit.    Review of Systems  {Labs  Heme  Chem  Endocrine  Serology  Results Review (optional):23779}   Objective    BP 116/74 (BP Location: Left Arm, Patient Position: Sitting, Cuff Size: Large)   Pulse 76   Temp 98 F (36.7 C) (Temporal)   Resp 16   Wt 177 lb 3.2 oz (80.4 kg)   BMI 26.17 kg/m  {Show previous vital signs (optional):23777}  Physical Exam  ***  No results found for any visits on 10/15/22.  Assessment & Plan     ***  No follow-ups on file.      I, Lavon Paganini, MD, have reviewed all documentation for this visit. The documentation on 10/15/22 for the exam, diagnosis, procedures, and orders are all accurate and complete.   Verlinda Slotnick, Dionne Bucy, MD, MPH [facility]

## 2022-10-16 LAB — CBC WITH DIFFERENTIAL/PLATELET
Basophils Absolute: 0.1 10*3/uL (ref 0.0–0.2)
Basos: 1 %
EOS (ABSOLUTE): 0.1 10*3/uL (ref 0.0–0.4)
Eos: 1 %
Hematocrit: 41.8 % (ref 37.5–51.0)
Hemoglobin: 14.1 g/dL (ref 13.0–17.7)
Immature Grans (Abs): 0.1 10*3/uL (ref 0.0–0.1)
Immature Granulocytes: 1 %
Lymphocytes Absolute: 1 10*3/uL (ref 0.7–3.1)
Lymphs: 14 %
MCH: 30.9 pg (ref 26.6–33.0)
MCHC: 33.7 g/dL (ref 31.5–35.7)
MCV: 92 fL (ref 79–97)
Monocytes Absolute: 0.6 10*3/uL (ref 0.1–0.9)
Monocytes: 9 %
Neutrophils Absolute: 5.6 10*3/uL (ref 1.4–7.0)
Neutrophils: 74 %
Platelets: 306 10*3/uL (ref 150–450)
RBC: 4.57 x10E6/uL (ref 4.14–5.80)
RDW: 12.6 % (ref 11.6–15.4)
WBC: 7.4 10*3/uL (ref 3.4–10.8)

## 2022-10-16 LAB — COMPREHENSIVE METABOLIC PANEL
ALT: 29 IU/L (ref 0–44)
AST: 28 IU/L (ref 0–40)
Albumin/Globulin Ratio: 2 (ref 1.2–2.2)
Albumin: 4.7 g/dL (ref 3.8–4.9)
Alkaline Phosphatase: 118 IU/L (ref 44–121)
BUN/Creatinine Ratio: 18 (ref 9–20)
BUN: 19 mg/dL (ref 6–24)
Bilirubin Total: 0.5 mg/dL (ref 0.0–1.2)
CO2: 24 mmol/L (ref 20–29)
Calcium: 9.5 mg/dL (ref 8.7–10.2)
Chloride: 101 mmol/L (ref 96–106)
Creatinine, Ser: 1.05 mg/dL (ref 0.76–1.27)
Globulin, Total: 2.3 g/dL (ref 1.5–4.5)
Glucose: 106 mg/dL — ABNORMAL HIGH (ref 70–99)
Potassium: 3.6 mmol/L (ref 3.5–5.2)
Sodium: 140 mmol/L (ref 134–144)
Total Protein: 7 g/dL (ref 6.0–8.5)
eGFR: 82 mL/min/{1.73_m2} (ref >59)

## 2022-10-16 LAB — RPR: RPR Ser Ql: NONREACTIVE

## 2022-10-16 LAB — B12 AND FOLATE PANEL
Folate: 14.2 ng/mL (ref >3.0)
Vitamin B-12: 732 pg/mL (ref 232–1245)

## 2022-10-16 LAB — TSH: TSH: 2.11 u[IU]/mL (ref 0.450–4.500)

## 2022-10-16 LAB — HIV ANTIBODY (ROUTINE TESTING W REFLEX): HIV Screen 4th Generation wRfx: NONREACTIVE

## 2022-10-24 ENCOUNTER — Ambulatory Visit
Admission: RE | Admit: 2022-10-24 | Discharge: 2022-10-24 | Disposition: A | Discharge status: Home / Self Care | Payer: 59 | Source: Ambulatory Visit | Attending: Family Medicine | Admitting: Family Medicine

## 2022-10-24 DIAGNOSIS — R251 Tremor, unspecified: Secondary | ICD-10-CM | POA: Diagnosis present

## 2022-10-24 DIAGNOSIS — R2689 Other abnormalities of gait and mobility: Secondary | ICD-10-CM | POA: Diagnosis present

## 2022-10-24 DIAGNOSIS — R413 Other amnesia: Secondary | ICD-10-CM | POA: Insufficient documentation

## 2022-10-30 ENCOUNTER — Other Ambulatory Visit: Payer: Self-pay | Admitting: Family Medicine

## 2022-10-31 ENCOUNTER — Other Ambulatory Visit: Payer: Self-pay | Admitting: Family Medicine

## 2022-10-31 NOTE — Telephone Encounter (Signed)
Medication Refill - Medication:  lisinopril (ZESTRIL) 40 MG tablet  Phone:   Fax:       Has the patient contacted their pharmacy? Yes.   (Agent: If no, request that the patient contact the pharmacy for the refill. If patient does not wish to contact the pharmacy document the reason why and proceed with request.) (Agent: If yes, when and what did the pharmacy advise?) Pt was only given a 30 day refill written on script, Pt had a visit on 4/2 and had BP chked and also bloodwork and everything was fine, please resubmit script as for 30 days b/c no visit submit for 90 days for ins purposes  Preferred Pharmacy (with phone number or street name):  [id number] - [address] AT [facility] [address] Phone: [phone]  Fax: [phone]     Has the patient been seen for an appointment in the last year OR does the patient have an upcoming appointment? Yes.   10/15/22  Agent: Please be advised that RX refills may take up to 3 business days. We ask that you follow-up with your pharmacy.

## 2022-11-01 ENCOUNTER — Telehealth: Payer: Self-pay

## 2022-11-01 NOTE — Telephone Encounter (Signed)
Unable to refill per protocol, Rx request was refilled 10/31/22 for 30 days, duplicate request.  Requested Prescriptions  Pending Prescriptions Disp Refills   lisinopril (ZESTRIL) 40 MG tablet 30 tablet 0     Cardiovascular:  ACE Inhibitors Passed - 10/31/2022  4:52 PM      Passed - Cr in normal range and within 180 days    Creatinine, Ser  Date Value Ref Range Status  10/15/2022 1.05 0.76 - 1.27 mg/dL Final         Passed - K in normal range and within 180 days    Potassium  Date Value Ref Range Status  10/15/2022 3.6 3.5 - 5.2 mmol/L Final         Passed - Patient is not pregnant      Passed - Last BP in normal range    BP Readings from Last 1 Encounters:  10/15/22 116/74         Passed - Valid encounter within last 6 months    Recent Outpatient Visits           2 weeks ago Imbalance   [facility] [place], Marzella Schlein, MD   6 months ago Annual physical exam   [facility] Car[place], DO   1 year ago Primary hypertension   [facility] [place], Marzella Schlein, MD   1 year ago Primary hypertension   [facility] [place], Marzella Schlein, MD   1 year ago Encounter for annual physical exam   [facility] [place], Marzella Schlein, MD

## 2022-11-01 NOTE — Telephone Encounter (Signed)
LMTCB, PEC Triage Nurse may give patient results     Copied from CRM [id number]. Topic: General - Other >> Nov 01, 2022  1:43 PM Roy Little wrote: Reason for CRM: The patient's wife has called to follow up on a request for a 90 day supply of lisinopril (ZESTRIL) 40 MG tablet [[id number]]  Please contact further when possible

## 2022-11-03 ENCOUNTER — Other Ambulatory Visit: Payer: Self-pay | Admitting: Family Medicine

## 2022-11-03 DIAGNOSIS — I10 Essential (primary) hypertension: Secondary | ICD-10-CM

## 2022-11-03 DIAGNOSIS — F32A Depression, unspecified: Secondary | ICD-10-CM

## 2022-11-28 ENCOUNTER — Other Ambulatory Visit: Payer: Self-pay | Admitting: Family Medicine

## 2022-11-29 ENCOUNTER — Telehealth: Payer: Self-pay | Admitting: Family Medicine

## 2022-11-29 NOTE — Telephone Encounter (Signed)
Patient is requesting a 90 day supply of   lisinopril (ZESTRIL) 40 MG tablet

## 2022-11-29 NOTE — Telephone Encounter (Signed)
Requested medication (s) are due for refill today: yes  Requested medication (s) are on the active medication list: yes  Last refill:  10/31/22 #30  Future visit scheduled: no  Notes to clinic:  called pt and LM on VM to call back to schedule med refill appt   Requested Prescriptions  Pending Prescriptions Disp Refills   lisinopril (ZESTRIL) 40 MG tablet [Pharmacy Med Name: LISINOPRIL 40MG  TABLETS] 30 tablet 0    Sig: TAKE 1 TABLET(40 MG) BY MOUTH DAILY     Cardiovascular:  ACE Inhibitors Passed - 11/28/2022  8:06 PM      Passed - Cr in normal range and within 180 days    Creatinine, Ser  Date Value Ref Range Status  10/15/2022 1.05 0.76 - 1.27 mg/dL Final         Passed - K in normal range and within 180 days    Potassium  Date Value Ref Range Status  10/15/2022 3.6 3.5 - 5.2 mmol/L Final         Passed - Patient is not pregnant      Passed - Last BP in normal range    BP Readings from Last 1 Encounters:  10/15/22 116/74         Passed - Valid encounter within last 6 months    Recent Outpatient Visits           1 month ago Imbalance   [facility] [place], Marzella Schlein, MD   7 months ago Annual physical exam   [facility] Lar[facility], DO   1 year ago Primary hypertension   [facility] [place], Marzella Schlein, MD   1 year ago Primary hypertension   [place] [place], Marzella Schlein, MD   1 year ago Encounter for annual physical exam   [facility] [place], Marzella Schlein, MD

## 2022-12-01 ENCOUNTER — Encounter: Payer: Self-pay | Admitting: Family Medicine

## 2022-12-02 NOTE — Telephone Encounter (Signed)
Please call and triage. Needs to hold HCTZ and lisinopril (since he is out of it anyways). Needs an appt to be evaluated also

## 2022-12-03 ENCOUNTER — Ambulatory Visit: Payer: 59 | Admitting: Family Medicine

## 2022-12-03 ENCOUNTER — Encounter: Payer: Self-pay | Admitting: Family Medicine

## 2022-12-03 VITALS — BP 147/87 | HR 66 | Temp 97.9°F | Resp 12 | Wt 186.8 lb

## 2022-12-03 DIAGNOSIS — M25561 Pain in right knee: Secondary | ICD-10-CM | POA: Diagnosis not present

## 2022-12-03 DIAGNOSIS — I10 Essential (primary) hypertension: Secondary | ICD-10-CM | POA: Diagnosis not present

## 2022-12-03 DIAGNOSIS — R55 Syncope and collapse: Secondary | ICD-10-CM | POA: Diagnosis not present

## 2022-12-03 DIAGNOSIS — I951 Orthostatic hypotension: Secondary | ICD-10-CM | POA: Insufficient documentation

## 2022-12-03 MED ORDER — LISINOPRIL 20 MG PO TABS: 20.0000 mg | ORAL_TABLET | Freq: Every day | ORAL | 1 refills | Status: DC

## 2022-12-03 NOTE — Assessment & Plan Note (Signed)
BP slightly elevated today, but with orthostatic hypotension as below Continue amlodipine and metoprolol at current dose Continue to hold HCTZ Will resume half dose of lisinopril at 20 mg daily

## 2022-12-03 NOTE — Assessment & Plan Note (Signed)
New problem with 2 episodes of syncope recently Patient still has a 15 point systolic blood pressure drop from sitting to standing today despite elevated sitting blood pressure Discussed that his finasteride can cause orthostatic hypotension, but patient is very hesitant to stop this medication as it has stopped his hair loss Will continue to hold HCTZ Will cautiously add back 20 mg of lisinopril daily which is half of his previous dose

## 2022-12-03 NOTE — Assessment & Plan Note (Signed)
New problem No concerning features for arrhythmia/seizure Seems to be related to orthostatic hypotension as above Will manage his BP meds as above Return precautions discussed

## 2022-12-03 NOTE — Assessment & Plan Note (Signed)
Pain and swelling of the right knee after fall in the setting of syncope Patient declines imaging or further evaluation at this time He is resting and icing Work note given

## 2022-12-03 NOTE — Progress Notes (Signed)
I,Sulibeya S Dimas,acting as a Neurosurgeon for Shirlee Latch, MD.,have documented all relevant documentation on the behalf of Shirlee Latch, MD,as directed by  Shirlee Latch, MD while in the presence of Shirlee Latch, MD.     Established patient visit   Patient: Roy Little   DOB: 14-Jan-1966   57 y.o. Male  MRN: [id number] Visit Date: 12/03/2022  Today's healthcare provider: Shirlee Latch, MD   Chief Complaint  Patient presents with   Loss of Consciousness   Subjective    Loss of Consciousness This is a new problem. The current episode started 1 to 4 weeks ago. Episode frequency: twice in the last 8 days. He lost consciousness for a period of less than 1 minute (about 30 seconds). Nothing aggravates the symptoms. Associated symptoms include an aura, dizziness and a visual change. Pertinent negatives include no bladder incontinence, bowel incontinence, chest pain, confusion, headaches, light-headedness, nausea, slurred speech, vomiting or weakness. He has tried nothing for the symptoms.   No palpitations Stopped lisinopril and HCTZ since 5/20  Orthostatic Vitals for the past 48 hrs (Last 6 readings):  Patient Position BP Pulse BP Location Cuff Size Patient Position (if appropriate) BP- Standing at 0 minutes Pulse- Standing at 0 minutes BP- Sitting Pulse- Sitting BP- Lying Pulse- Lying  12/03/22 1356 Sitting (!) 147/87 66 Left Arm Large -- -- -- -- -- -- --  12/03/22 1406 -- -- -- -- -- Orthostatic Vitals 128/85 97 143/88 97 133/79 96     Medications: Outpatient Medications Prior to Visit  Medication Sig   amLODipine (NORVASC) 10 MG tablet TAKE 1 TABLET(10 MG) BY MOUTH DAILY   atorvastatin (LIPITOR) 40 MG tablet TAKE 1 TABLET(40 MG) BY MOUTH AT BEDTIME   finasteride (PROSCAR) 5 MG tablet TAKE 1 TABLET(5 MG) BY MOUTH DAILY   metoprolol tartrate (LOPRESSOR) 25 MG tablet TAKE 1 TABLET(25 MG) BY MOUTH TWICE DAILY   sertraline (ZOLOFT) 100 MG tablet Take 1 tablet  (100 mg total) by mouth daily.   [DISCONTINUED] hydrochlorothiazide (HYDRODIURIL) 25 MG tablet TAKE 1 TABLET(25 MG) BY MOUTH DAILY   [DISCONTINUED] lisinopril (ZESTRIL) 40 MG tablet TAKE 1 TABLET(40 MG) BY MOUTH DAILY   No facility-administered medications prior to visit.    Review of Systems  Constitutional:  Positive for activity change.  Respiratory:  Negative for chest tightness and shortness of breath.   Cardiovascular:  Positive for syncope. Negative for chest pain.  Gastrointestinal:  Negative for bowel incontinence, nausea and vomiting.  Genitourinary:  Negative for bladder incontinence.  Neurological:  Positive for dizziness. Negative for weakness, light-headedness and headaches.  Psychiatric/Behavioral:  Negative for confusion.        Objective    BP (!) 147/87 (BP Location: Left Arm, Patient Position: Sitting, Cuff Size: Large)   Pulse 66   Temp 97.9 F (36.6 C) (Temporal)   Resp 12   Wt 186 lb 12.8 oz (84.7 kg)   SpO2 96%   BMI 27.59 kg/m    Orthostatic Vitals for the past 48 hrs (Last 6 readings):  Patient Position BP Pulse BP Location Cuff Size Patient Position (if appropriate) BP- Standing at 0 minutes Pulse- Standing at 0 minutes BP- Sitting Pulse- Sitting BP- Lying Pulse- Lying  12/03/22 1356 Sitting (!) 147/87 66 Left Arm Large -- -- -- -- -- -- --  12/03/22 1406 -- -- -- -- -- Orthostatic Vitals 128/85 97 143/88 97 133/79 96    Physical Exam Vitals reviewed.  Constitutional:  General: He is not in acute distress.    Appearance: Normal appearance. He is not diaphoretic.  HENT:     Head: Normocephalic and atraumatic.  Eyes:     General: No scleral icterus.    Conjunctiva/sclera: Conjunctivae normal.  Cardiovascular:     Rate and Rhythm: Normal rate and regular rhythm.     Pulses: Normal pulses.     Heart sounds: Normal heart sounds. No murmur heard. Pulmonary:     Effort: Pulmonary effort is normal. No respiratory distress.     Breath sounds:  Normal breath sounds. No wheezing or rhonchi.  Musculoskeletal:     Cervical back: Neck supple.     Right lower leg: No edema.     Left lower leg: No edema.  Lymphadenopathy:     Cervical: No cervical adenopathy.  Skin:    General: Skin is warm and dry.     Findings: No rash.  Neurological:     Mental Status: He is alert and oriented to person, place, and time. Mental status is at baseline.  Psychiatric:        Mood and Affect: Mood normal.        Behavior: Behavior normal.       No results found for any visits on 12/03/22.  Assessment & Plan     Problem List Items Addressed This Visit       Cardiovascular and Mediastinum   Hypertension    BP slightly elevated today, but with orthostatic hypotension as below Continue amlodipine and metoprolol at current dose Continue to hold HCTZ Will resume half dose of lisinopril at 20 mg daily      Relevant Medications   lisinopril (ZESTRIL) 20 MG tablet   Orthostatic hypotension    New problem with 2 episodes of syncope recently Patient still has a 15 point systolic blood pressure drop from sitting to standing today despite elevated sitting blood pressure Discussed that his finasteride can cause orthostatic hypotension, but patient is very hesitant to stop this medication as it has stopped his hair loss Will continue to hold HCTZ Will cautiously add back 20 mg of lisinopril daily which is half of his previous dose      Relevant Medications   lisinopril (ZESTRIL) 20 MG tablet     Other   Acute pain of right knee    Pain and swelling of the right knee after fall in the setting of syncope Patient declines imaging or further evaluation at this time He is resting and icing Work note given      Syncope - Primary    New problem No concerning features for arrhythmia/seizure Seems to be related to orthostatic hypotension as above Will manage his BP meds as above Return precautions discussed        Return in about 2 weeks  (around 12/17/2022) for BP f/u.      I, Shirlee Latch, MD, have reviewed all documentation for this visit. The documentation on 12/03/22 for the exam, diagnosis, procedures, and orders are all accurate and complete.   Stiles Maxcy, Marzella Schlein, MD, MPH [facility]

## 2022-12-12 ENCOUNTER — Ambulatory Visit: Payer: 59 | Admitting: Family Medicine

## 2022-12-12 ENCOUNTER — Encounter: Payer: Self-pay | Admitting: Family Medicine

## 2022-12-12 ENCOUNTER — Telehealth: Payer: Self-pay | Admitting: Family Medicine

## 2022-12-12 ENCOUNTER — Telehealth: Payer: Self-pay

## 2022-12-12 VITALS — BP 140/70 | HR 68 | Resp 16 | Wt 180.0 lb

## 2022-12-12 DIAGNOSIS — R413 Other amnesia: Secondary | ICD-10-CM | POA: Diagnosis not present

## 2022-12-12 DIAGNOSIS — I10 Essential (primary) hypertension: Secondary | ICD-10-CM

## 2022-12-12 DIAGNOSIS — R251 Tremor, unspecified: Secondary | ICD-10-CM | POA: Diagnosis not present

## 2022-12-12 DIAGNOSIS — R55 Syncope and collapse: Secondary | ICD-10-CM

## 2022-12-12 DIAGNOSIS — F411 Generalized anxiety disorder: Secondary | ICD-10-CM | POA: Diagnosis not present

## 2022-12-12 DIAGNOSIS — F321 Major depressive disorder, single episode, moderate: Secondary | ICD-10-CM

## 2022-12-12 DIAGNOSIS — R45851 Suicidal ideations: Secondary | ICD-10-CM

## 2022-12-12 DIAGNOSIS — R2689 Other abnormalities of gait and mobility: Secondary | ICD-10-CM | POA: Diagnosis not present

## 2022-12-12 MED ORDER — BUPROPION HCL ER (XL) 150 MG PO TB24
150.0000 mg | ORAL_TABLET | Freq: Every day | ORAL | 3 refills | Status: DC
Start: 2022-12-12 — End: 2023-02-06

## 2022-12-12 NOTE — Assessment & Plan Note (Signed)
No recent episodes reported. Blood pressure stable on current regimen. -Continue current blood pressure medications: Lisinopril 20mg  daily, Amlodipine 10mg  daily, and Metoprolol 25mg  twice daily.

## 2022-12-12 NOTE — Telephone Encounter (Signed)
Copied from CRM [id number]. Topic: General - Other >> Dec 11, 2022  4:56 PM Jannifer Rodney M wrote: Reason for CRM: Pt wife reports that FMLA paperwork will be faxed to the office.

## 2022-12-12 NOTE — Assessment & Plan Note (Signed)
Significant increase in depression score and passive suicidal thoughts reported. No active plan. Patient is currently on Zoloft 100mg daily. -Add Wellbutrin XL 150mg daily to augment Zoloft. -Continue Zoloft 100mg daily. -Schedule follow-up in 1 month to assess response to medication adjustment. -Advised patient and spouse to seek immediate emergency care if active suicidal plan develops. 

## 2022-12-12 NOTE — Telephone Encounter (Signed)
Will complete when it arrives.  Of note, patient does not need to be charged as this was part of his visit today.

## 2022-12-12 NOTE — Telephone Encounter (Signed)
  [employer] paperwork was received on 12/12/2022 & placed in provider's mailbox. Please allow 7-10 business days to be completed. Thank you

## 2022-12-12 NOTE — Assessment & Plan Note (Signed)
Well controlled Chronic Continue zoloft 100mg  daily

## 2022-12-12 NOTE — Assessment & Plan Note (Signed)
No further syncope Standing BP well controlled Continue current meds

## 2022-12-12 NOTE — Assessment & Plan Note (Signed)
Significant increase in depression score and passive suicidal thoughts reported. No active plan. Patient is currently on Zoloft 100mg  daily. -Add Wellbutrin XL 150mg  daily to augment Zoloft. -Continue Zoloft 100mg  daily. -Schedule follow-up in 1 month to assess response to medication adjustment. -Advised patient and spouse to seek immediate emergency care if active suicidal plan develops.

## 2022-12-12 NOTE — Progress Notes (Signed)
Established patient visit   Patient: Roy Little   DOB: [birth year]   57 y.o. Male  MRN: [id number] Visit Date: 12/12/2022  Today's healthcare provider: Shirlee Latch, MD   No chief complaint on file.  Subjective    HPI  Discussed the use of AI scribe software for clinical note transcription with the patient, who gave verbal consent to proceed.  History of Present Illness   The patient, with a history of cancer and currently on Zoloft 100mg  for anxiety, presents with worsening balance issues, tremors, and memory loss. The patient reports difficulty functioning at work due to these symptoms, expressing fear of losing his job after 30 years of service. The patient's spouse corroborates these observations, noting the patient's increased tremors, particularly in the left hand, and balance issues. The patient also reports periods of feeling "strange" but struggles to articulate these sensations.  The patient's memory loss is of particular concern, with instances of forgetting recent meals and repeating questions. The patient's spouse has noticed this forgetfulness and is concerned about the patient's cognitive decline.  In addition to these physical symptoms, the patient is experiencing increased depression and anxiety. The patient admits to passive suicidal thoughts, with no active plan, but the presence of firearms in the home raises safety concerns. The patient's spouse has taken precautionary measures by hiding the firearms.  The patient's weight fluctuates with his symptoms, losing weight during periods of feeling unwell. The patient also reports difficulty sleeping, despite taking medication to aid sleep.          12/12/2022   10:04 AM 12/03/2022    2:02 PM 10/15/2022    1:08 PM 04/08/2022    8:44 AM 09/06/2021    1:49 PM  Depression screen PHQ 2/9  Decreased Interest 1 0 1 0 0  Down, Depressed, Hopeless 2 0 0 0 0  PHQ - 2 Score 3 0 1 0 0  Altered sleeping 1 0 0 0 0   Tired, decreased energy 1 1 1 1 1   Change in appetite 1 0 1 0 0  Feeling bad or failure about yourself  2 0 0 0 0  Trouble concentrating 2 1 2  0 0  Moving slowly or fidgety/restless 0 0 0 0 0  Suicidal thoughts 1 0 0 0 0  PHQ-9 Score 11 2 5 1 1   Difficult doing work/chores Extremely dIfficult Somewhat difficult Somewhat difficult Not difficult at all Not difficult at all    Medications: Outpatient Medications Prior to Visit  Medication Sig   amLODipine (NORVASC) 10 MG tablet TAKE 1 TABLET(10 MG) BY MOUTH DAILY   atorvastatin (LIPITOR) 40 MG tablet TAKE 1 TABLET(40 MG) BY MOUTH AT BEDTIME   finasteride (PROSCAR) 5 MG tablet TAKE 1 TABLET(5 MG) BY MOUTH DAILY   lisinopril (ZESTRIL) 20 MG tablet Take 1 tablet (20 mg total) by mouth daily.   metoprolol tartrate (LOPRESSOR) 25 MG tablet TAKE 1 TABLET(25 MG) BY MOUTH TWICE DAILY   sertraline (ZOLOFT) 100 MG tablet Take 1 tablet (100 mg total) by mouth daily.   No facility-administered medications prior to visit.    Review of Systems per HPI     Objective    BP (!) 140/70 (BP Location: Left Arm, Patient Position: Sitting, Cuff Size: Normal)   Pulse 68   Resp 16   Wt 180 lb (81.6 kg)   SpO2 97%   BMI 26.58 kg/m    Physical Exam Vitals reviewed.  Constitutional:  General: He is not in acute distress.    Appearance: Normal appearance. He is not diaphoretic.  HENT:     Head: Normocephalic and atraumatic.  Eyes:     General: No scleral icterus.    Conjunctiva/sclera: Conjunctivae normal.  Cardiovascular:     Rate and Rhythm: Normal rate and regular rhythm.     Heart sounds: Normal heart sounds. No murmur heard. Pulmonary:     Effort: Pulmonary effort is normal. No respiratory distress.     Breath sounds: Normal breath sounds. No wheezing or rhonchi.  Musculoskeletal:     Cervical back: Neck supple.     Right lower leg: No edema.     Left lower leg: No edema.  Lymphadenopathy:     Cervical: No cervical adenopathy.   Skin:    General: Skin is warm and dry.     Findings: No rash.  Neurological:     Mental Status: He is alert and oriented to person, place, and time.     Gait: Gait abnormal.     Comments: +tremor L>R hand  Psychiatric:        Mood and Affect: Mood is depressed. Affect is tearful.        Behavior: Behavior normal.        Thought Content: Thought content includes suicidal ideation. Thought content does not include suicidal plan.       No results found for any visits on 12/12/22.  Assessment & Plan     Problem List Items Addressed This Visit       Cardiovascular and Mediastinum   Hypertension    No further syncope Standing BP well controlled Continue current meds        Other   GAD (generalized anxiety disorder)    Well controlled Chronic Continue zoloft 100mg  daily      Relevant Medications   buPROPion (WELLBUTRIN XL) 150 MG 24 hr tablet   Current moderate episode of major depressive disorder without prior episode (HCC)    Significant increase in depression score and passive suicidal thoughts reported. No active plan. Patient is currently on Zoloft 100mg  daily. -Add Wellbutrin XL 150mg  daily to augment Zoloft. -Continue Zoloft 100mg  daily. -Schedule follow-up in 1 month to assess response to medication adjustment. -Advised patient and spouse to seek immediate emergency care if active suicidal plan develops.      Relevant Medications   buPROPion (WELLBUTRIN XL) 150 MG 24 hr tablet   Syncope    No recent episodes reported. Blood pressure stable on current regimen. -Continue current blood pressure medications: Lisinopril 20mg  daily, Amlodipine 10mg  daily, and Metoprolol 25mg  twice daily.      Suicidal ideation    Significant increase in depression score and passive suicidal thoughts reported. No active plan. Patient is currently on Zoloft 100mg  daily. -Add Wellbutrin XL 150mg  daily to augment Zoloft. -Continue Zoloft 100mg  daily. -Schedule follow-up in 1 month  to assess response to medication adjustment. -Advised patient and spouse to seek immediate emergency care if active suicidal plan develops.      Other Visit Diagnoses     Memory loss    -  Primary   Imbalance       Tremor            Tremor and Balance Issues: Noted worsening tremor, particularly in left hand, and balance issues affecting daily function and job performance. -Neurology consultation scheduled for next week to further evaluate and manage these symptoms.  Memory Loss: Reports of forgetfulness and difficulty  with tasks at work. -Neurology consultation next week will also address this issue.  Work-related Stress: Patient reports significant stress related to job performance and fear of job loss due to health issues. -Initiate FMLA and short-term disability for 6 weeks, with reassessment at end of this period.        Return in about 4 weeks (around 01/09/2023) for MDD/GAD f/u.      Total time spent on today's visit was greater than 40 minutes, including both face-to-face time and nonface-to-face time personally spent on review of chart (labs and imaging), discussing labs and goals, discussing further work-up, treatment options, answering patient's questions, and coordinating care.    I, Shirlee Latch, MD, have reviewed all documentation for this visit. The documentation on 12/12/22 for the exam, diagnosis, procedures, and orders are all accurate and complete.   Kaula Klenke, Marzella Schlein, MD, MPH [facility]

## 2022-12-13 ENCOUNTER — Telehealth: Payer: Self-pay | Admitting: Family Medicine

## 2022-12-13 MED ORDER — ATORVASTATIN CALCIUM 40 MG PO TABS: ORAL_TABLET | ORAL | 0 refills | Status: DC

## 2022-12-13 MED ORDER — FINASTERIDE 5 MG PO TABS: ORAL_TABLET | ORAL | 1 refills | Status: DC

## 2022-12-13 NOTE — Telephone Encounter (Signed)
Called pt and let him know that paperwork has been completed and faxed and will leave original at the front desk for p/u.

## 2022-12-13 NOTE — Telephone Encounter (Signed)
[facility] pharmacy faxed refill request for the following medications:    finasteride (PROSCAR) 5 MG tablet   Please advise

## 2022-12-13 NOTE — Telephone Encounter (Signed)
[facility] pharmacy requesting prescription refill atorvastatin (LIPITOR) 40 MG tablet  Please advise 

## 2022-12-13 NOTE — Telephone Encounter (Signed)
Atorvastatin filled and Finasteride

## 2022-12-16 ENCOUNTER — Ambulatory Visit: Payer: 59 | Admitting: Family Medicine

## 2022-12-20 ENCOUNTER — Other Ambulatory Visit: Payer: Self-pay | Admitting: Family Medicine

## 2022-12-20 NOTE — Telephone Encounter (Signed)
Unable to refill per protocol, Rx expired. Discontinued 12/03/22.  Requested Prescriptions  Pending Prescriptions Disp Refills   hydrochlorothiazide (HYDRODIURIL) 25 MG tablet [Pharmacy Med Name: HYDROCHLOROTHIAZIDE 25MG  TABLETS] 90 tablet 1    Sig: TAKE 1 TABLET(25 MG) BY MOUTH DAILY     Cardiovascular: Diuretics - Thiazide Failed - 12/20/2022 12:16 PM      Failed - Last BP in normal range    BP Readings from Last 1 Encounters:  12/12/22 (!) 140/70         Passed - Cr in normal range and within 180 days    Creatinine, Ser  Date Value Ref Range Status  10/15/2022 1.05 0.76 - 1.27 mg/dL Final         Passed - K in normal range and within 180 days    Potassium  Date Value Ref Range Status  10/15/2022 3.6 3.5 - 5.2 mmol/L Final         Passed - Na in normal range and within 180 days    Sodium  Date Value Ref Range Status  10/15/2022 140 134 - 144 mmol/L Final         Passed - Valid encounter within last 6 months    Recent Outpatient Visits           1 week ago Memory loss   [facility] [place], Marzella Schlein, MD   2 weeks ago Syncope, unspecified syncope type   [facility] [place], Marzella Schlein, MD   2 months ago Imbalance   [facility] [place], Marzella Schlein, MD   8 months ago Annual physical exam   [facility] Caro Laroche, DO   1 year ago Primary hypertension   [place] [facility] [place], Marzella Schlein, MD       Future Appointments             In 3 weeks [name], Marzella Schlein, MD [facility]

## 2023-01-14 ENCOUNTER — Ambulatory Visit: Payer: 59 | Admitting: Family Medicine

## 2023-01-14 ENCOUNTER — Encounter: Payer: Self-pay | Admitting: Family Medicine

## 2023-01-14 VITALS — BP 171/95 | HR 66 | Ht 69.0 in | Wt 181.9 lb

## 2023-01-14 DIAGNOSIS — F411 Generalized anxiety disorder: Secondary | ICD-10-CM | POA: Diagnosis not present

## 2023-01-14 DIAGNOSIS — I10 Essential (primary) hypertension: Secondary | ICD-10-CM

## 2023-01-14 DIAGNOSIS — F321 Major depressive disorder, single episode, moderate: Secondary | ICD-10-CM | POA: Diagnosis not present

## 2023-01-14 NOTE — Progress Notes (Signed)
Established Patient Office Visit  Subjective   Patient ID: Roy Little, male    DOB: [birth year]  Age: 57 y.o. MRN: [id number]  Chief Complaint  Patient presents with   Medical Management of Chronic Issues    Patient present for 1 month MDD/Gad follow-up. Patient was last seen on 12/12/22 and advised to add Wellbutrin XL 150mg  daily to augment Zoloft and continue Zoloft 100mg  daily. Patient was also advised to seek immediate emergency care if active suicidal plan develops. Patient reports to be taking medication as directed with no side effects     HPI   Discussed the use of AI scribe software for clinical note transcription with the patient, who gave verbal consent to proceed.  History of Present Illness   The patient, with a family history of Parkinson's and Lewy body dementia, presents with cognitive impairment, hand tremors, changes in gait, and anxiety. These symptoms have been acute on chronic, with a recent significant worsening. The patient has also been experiencing balance issues, particularly when walking heel-to-toe or standing on one foot, which has been causing distress. The patient is currently on Wellbutrin and Zoloft, which seem to be managing the patient's mood effectively. The patient is also taking Carbidopa Levodopa, which may be helping with the tremors. The patient's blood pressure has been high during the visit, which may be due to stress.         01/14/2023    3:32 PM 12/12/2022   10:04 AM 12/03/2022    2:02 PM 10/15/2022    1:08 PM 04/08/2022    8:44 AM  Depression screen PHQ 2/9  Decreased Interest 0 1 0 1 0  Down, Depressed, Hopeless 1 2 0 0 0  PHQ - 2 Score 1 3 0 1 0  Altered sleeping 1 1 0 0 0  Tired, decreased energy 1 1 1 1 1   Change in appetite 0 1 0 1 0  Feeling bad or failure about yourself  0 2 0 0 0  Trouble concentrating 1 2 1 2  0  Moving slowly or fidgety/restless 1 0 0 0 0  Suicidal thoughts 0 1 0 0 0  PHQ-9 Score 5 11 2 5 1   Difficult  doing work/chores Not difficult at all Extremely dIfficult Somewhat difficult Somewhat difficult Not difficult at all      10/06/2020   11:02 AM 06/30/2017    2:50 PM  GAD 7 : Generalized Anxiety Score  Nervous, Anxious, on Edge 0 1  Control/stop worrying 1 1  Worry too much - different things 1 0  Trouble relaxing 0 1  Restless 0 1  Easily annoyed or irritable 0 2  Afraid - awful might happen 0 1  Total GAD 7 Score 2 7  Anxiety Difficulty Not difficult at all Somewhat difficult       ROS    Objective:     BP (!) 171/95 (BP Location: Right Arm, Patient Position: Sitting, Cuff Size: Normal)   Pulse 66   Ht 5\' 9"  (1.753 m)   Wt 181 lb 14.4 oz (82.5 kg)   BMI 26.86 kg/m    Physical Exam Vitals reviewed.  Constitutional:      General: He is not in acute distress.    Appearance: Normal appearance. He is not diaphoretic.  HENT:     Head: Normocephalic and atraumatic.  Eyes:     General: No scleral icterus.    Conjunctiva/sclera: Conjunctivae normal.  Cardiovascular:     Rate and Rhythm:  Normal rate and regular rhythm.     Pulses: Normal pulses.     Heart sounds: Normal heart sounds. No murmur heard. Pulmonary:     Effort: Pulmonary effort is normal. No respiratory distress.     Breath sounds: Normal breath sounds. No wheezing or rhonchi.  Musculoskeletal:     Cervical back: Neck supple.     Right lower leg: No edema.     Left lower leg: No edema.  Lymphadenopathy:     Cervical: No cervical adenopathy.  Skin:    General: Skin is warm and dry.     Findings: No rash.  Neurological:     Mental Status: He is alert and oriented to person, place, and time. Mental status is at baseline.  Psychiatric:        Mood and Affect: Mood normal.        Behavior: Behavior normal.      No results found for any visits on 01/14/23.    The 10-year ASCVD risk score (Arnett DK, et al., 2019) is: 17.1%    Assessment & Plan:   Problem List Items Addressed This Visit        Cardiovascular and Mediastinum   Hypertension    Elevated blood pressure readings in office (196/96, 171/95, 180/90). Currently on amlodipine 10mg  and lisinopril 20mg  daily. -Check blood pressure at home and report readings. -If readings remain high (170s, 180s, 190s), increase lisinopril to 40mg  daily and notify doctor.        Other   GAD (generalized anxiety disorder) - Primary    Stable on current regimen of Zoloft 100mg  and Wellbutrin XL 150mg  daily. No suicidal thoughts reported. -Continue Zoloft 100mg  and Wellbutrin XL 150mg  daily.      Current moderate episode of major depressive disorder without prior episode (HCC)    Stable on current regimen of Zoloft 100mg  and Wellbutrin XL 150mg  daily. No suicidal thoughts reported. -Continue Zoloft 100mg  and Wellbutrin XL 150mg  daily.          Possible Parkinson's Disease, multiple neuro symptoms: Acute on chronic cognitive impairment, resting and intention tremors, gait changes, and family history of Parkinson's and Lewy body dementia. Undergoing extensive workup including skin biopsies, beta amyloid testing, autoimmune dementia evaluation, and neurocognitive testing. Started on carbidopa levodopa by neurologist. -Continue carbidopa levodopa as prescribed by neurologist. -Continue with neurologist's workup plan. - will extend STD and FMLA leave for work while undergoing workup        Return in about 4 weeks (around 02/11/2023) for chronic disease f/u.    Shirlee Latch, MD

## 2023-01-14 NOTE — Assessment & Plan Note (Signed)
Stable on current regimen of Zoloft 100mg  and Wellbutrin XL 150mg  daily. No suicidal thoughts reported. -Continue Zoloft 100mg  and Wellbutrin XL 150mg  daily.

## 2023-01-14 NOTE — Assessment & Plan Note (Signed)
Stable on current regimen of Zoloft 100mg and Wellbutrin XL 150mg daily. No suicidal thoughts reported. -Continue Zoloft 100mg and Wellbutrin XL 150mg daily. 

## 2023-01-14 NOTE — Assessment & Plan Note (Signed)
Elevated blood pressure readings in office (196/96, 171/95, 180/90). Currently on amlodipine 10mg  and lisinopril 20mg  daily. -Check blood pressure at home and report readings. -If readings remain high (170s, 180s, 190s), increase lisinopril to 40mg  daily and notify doctor.

## 2023-01-20 ENCOUNTER — Encounter: Payer: Self-pay | Admitting: Family Medicine

## 2023-02-01 ENCOUNTER — Other Ambulatory Visit: Payer: Self-pay | Admitting: Family Medicine

## 2023-02-01 DIAGNOSIS — I10 Essential (primary) hypertension: Secondary | ICD-10-CM

## 2023-02-03 ENCOUNTER — Telehealth: Payer: Self-pay | Admitting: Family Medicine

## 2023-02-03 DIAGNOSIS — F411 Generalized anxiety disorder: Secondary | ICD-10-CM

## 2023-02-03 DIAGNOSIS — F321 Major depressive disorder, single episode, moderate: Secondary | ICD-10-CM

## 2023-02-03 NOTE — Telephone Encounter (Signed)
Requested Prescriptions  Pending Prescriptions Disp Refills   metoprolol tartrate (LOPRESSOR) 25 MG tablet [Pharmacy Med Name: METOPROLOL TARTRATE 25MG  TABLETS] 180 tablet 0    Sig: TAKE 1 TABLET(25 MG) BY MOUTH TWICE DAILY     Cardiovascular:  Beta Blockers Failed - 02/01/2023  6:20 AM      Failed - Last BP in normal range    BP Readings from Last 1 Encounters:  01/14/23 (!) 171/95         Passed - Last Heart Rate in normal range    Pulse Readings from Last 1 Encounters:  01/14/23 66         Passed - Valid encounter within last 6 months    Recent Outpatient Visits           2 weeks ago GAD (generalized anxiety disorder)   [place] [facility] [place], Marzella Schlein, MD   1 month ago Memory loss   [facility] [place], Marzella Schlein, MD   2 months ago Syncope, unspecified syncope type   [facility] [place], Marzella Schlein, MD   3 months ago Imbalance   [place] [facility] [place], Marzella Schlein, MD   10 months ago Annual physical exam   [facility] Caro Lar[place], DO       Future Appointments             In 2 weeks [name], Marzella Schlein, MD [facility], [facility]

## 2023-02-03 NOTE — Telephone Encounter (Signed)
[facility] pharmacy faxed refill request for the following medications:   buPROPion (WELLBUTRIN XL) 150 MG 24 hr tablet     Please advise

## 2023-02-04 ENCOUNTER — Telehealth: Payer: Self-pay | Admitting: Family Medicine

## 2023-02-04 NOTE — Telephone Encounter (Addendum)
Patient's spouse, Victorino Dike, called in and stated that the pharmacy stated patient's prescription for buPROPion (WELLBUTRIN XL) 150 MG 24 hr tablet needs the quantity to be changed to 90 tablets at one time for insurance to pay for it, please advise.    Patients callback #: [phone]  [facility][id number] [place] Rough, [place] - [address] AT [facility] [address]  Phone: [phone] Fax: [phone]

## 2023-02-05 NOTE — Telephone Encounter (Signed)
Patient requesting refill too soon [facility] 12/12/22 #30 3RF (4 month supply) Patient should contact pharmacy and request next refill to be prepared

## 2023-02-06 MED ORDER — BUPROPION HCL ER (XL) 150 MG PO TB24: 150.0000 mg | ORAL_TABLET | Freq: Every day | ORAL | 1 refills | Status: DC

## 2023-02-17 ENCOUNTER — Ambulatory Visit: Payer: 59 | Admitting: Family Medicine

## 2023-02-17 ENCOUNTER — Encounter: Payer: Self-pay | Admitting: Family Medicine

## 2023-02-17 VITALS — BP 136/76 | HR 74 | Temp 97.8°F | Resp 12 | Ht 69.0 in | Wt 184.4 lb

## 2023-02-17 DIAGNOSIS — F411 Generalized anxiety disorder: Secondary | ICD-10-CM

## 2023-02-17 DIAGNOSIS — R2990 Unspecified symptoms and signs involving the nervous system: Secondary | ICD-10-CM

## 2023-02-17 DIAGNOSIS — E782 Mixed hyperlipidemia: Secondary | ICD-10-CM

## 2023-02-17 DIAGNOSIS — F321 Major depressive disorder, single episode, moderate: Secondary | ICD-10-CM

## 2023-02-17 DIAGNOSIS — I10 Essential (primary) hypertension: Secondary | ICD-10-CM | POA: Diagnosis not present

## 2023-02-17 DIAGNOSIS — R739 Hyperglycemia, unspecified: Secondary | ICD-10-CM | POA: Diagnosis not present

## 2023-02-17 NOTE — Progress Notes (Signed)
Established Patient Office Visit  Subjective   Patient ID: Roy Little, male    DOB: 27-Dec-1965  Age: 57 y.o. MRN: [id number]  Chief Complaint  Patient presents with   Medical Management of Chronic Issues    Hypertension-Continue Amlodipine 10 mg daily. Patient was advised to check BP at home and if readings remain high to increase lisinopril to 40 mg and advise PCP. GAD/Depression-continue Zoloft 100 mg and Wellbutrin XL 150 mg daily.  Patient reports BP at home is 120s/70s. He has not had to increase Lisinopril.     HPI  Discussed the use of AI scribe software for clinical note transcription with the patient, who gave verbal consent to proceed.  History of Present Illness   The patient, with a history of neurologic symptoms, presents with worsening balance and hand tremors. They describe their hands as "so bad" and their balance as "so, so bad." They report difficulty with daily tasks, such as drinking tea, and have experienced multiple near falls. 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 They also report memory issues, often forgetting tasks and having to retrace their steps.  The patient is currently on leave from work due to their symptoms. 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 They are in the process of applying for extended leave and possibly transitioning from short-term to long-term leave.  The patient is currently on amlodipine 10mg  daily, lisinopril 20mg  daily, and metoprolol 25mg  twice daily for their BP. They also take Zoloft 100mg  and Wellbutrin XL 150mg  for mood and anxiety. They report that their mood and anxiety have been well-managed on these medications.         ROS per HPI    Objective:     BP 136/76 (BP Location: Left Arm, Patient Position: Sitting, Cuff Size: Large)   Pulse  74   Temp 97.8 F (36.6 C) (Temporal)   Resp 12   Ht 5\' 9"  (1.753 m)   Wt 184 lb 6.4 oz (83.6 kg)   SpO2 97%   BMI 27.23 kg/m    Physical Exam Vitals reviewed.  Constitutional:      General: He is not in acute distress.    Appearance: Normal appearance. He is not diaphoretic.  HENT:     Head: Normocephalic and atraumatic.  Eyes:     General: No scleral icterus.    Conjunctiva/sclera: Conjunctivae normal.  Cardiovascular:     Rate and Rhythm: Normal rate and regular rhythm.     Heart sounds: Normal heart sounds. No murmur heard. Pulmonary:     Effort: Pulmonary effort is normal. No respiratory distress.     Breath sounds: Normal breath sounds. No wheezing or rhonchi.  Musculoskeletal:     Cervical back: Neck supple.     Right lower leg: No edema.     Left lower leg: No edema.  Lymphadenopathy:     Cervical: No cervical adenopathy.  Skin:    General: Skin is warm and dry.     Findings: No rash.  Neurological:     Mental Status: He is alert and oriented to person, place, and time. Mental status is at baseline.     Comments: +tremor  Psychiatric:        Mood and Affect: Mood normal.        Behavior: Behavior normal.  No results found for any visits on 02/17/23.    The 10-year ASCVD risk score (Arnett DK, et al., 2019) is: 11.7%    Assessment & Plan:   Problem List Items Addressed This Visit       Cardiovascular and Mediastinum   Hypertension - Primary   Relevant Orders   Comprehensive metabolic panel     Other   Hyperlipidemia   Relevant Orders   Lipid Panel With LDL/HDL Ratio   GAD (generalized anxiety disorder)   Current moderate episode of major depressive disorder without prior episode (HCC)   Other Visit Diagnoses     Hyperglycemia       Relevant Orders   Hemoglobin A1c   Multiple neurological symptoms              Neurologic symptoms Worsening tremors and balance issues, impacting daily activities and work. Pending neurology  appointment on October 7th, 2024. -Extend medical leave until October 8th, 2024. -Plan for neurocognitive testing as discussed with Dr. Clelia Croft.  Hypertension Well controlled on current regimen of Amlodipine 10mg  daily and Lisinopril 20mg  daily. -Continue current regimen.  Mood/Anxiety Stable on current regimen of Zoloft 100mg  daily and Wellbutrin XL 150mg  daily. -Continue current regimen.  General Health Maintenance -Order labs today. -Schedule physical appointment for two months from now, after neurology appointment.        Return in about 2 months (around 04/19/2023) for CPE.    Shirlee Latch, MD

## 2023-02-20 ENCOUNTER — Other Ambulatory Visit: Payer: Self-pay | Admitting: Family Medicine

## 2023-02-20 DIAGNOSIS — I10 Essential (primary) hypertension: Secondary | ICD-10-CM

## 2023-02-21 NOTE — Telephone Encounter (Signed)
Requested Prescriptions  Pending Prescriptions Disp Refills   amLODipine (NORVASC) 10 MG tablet [Pharmacy Med Name: AMLODIPINE BESYLATE 10MG  TABLETS] 90 tablet 1    Sig: TAKE 1 TABLET(10 MG) BY MOUTH DAILY     Cardiovascular: Calcium Channel Blockers 2 Passed - 02/20/2023  7:17 PM      Passed - Last BP in normal range    BP Readings from Last 1 Encounters:  02/17/23 136/76         Passed - Last Heart Rate in normal range    Pulse Readings from Last 1 Encounters:  02/17/23 74         Passed - Valid encounter within last 6 months    Recent Outpatient Visits           4 days ago Primary hypertension   [place] [place], Marzella Schlein, MD   1 month ago GAD (generalized anxiety disorder)   [place] [facility] [place], Marzella Schlein, MD   2 months ago Memory loss   [facility] [place], Marzella Schlein, MD   2 months ago Syncope, unspecified syncope type   [facility] [place], Marzella Schlein, MD   4 months ago Imbalance   [place] [facility] [place], Marzella Schlein, MD       Future Appointments             In 2 months [name], Marzella Schlein, MD [facility], [facility]

## 2023-02-25 DIAGNOSIS — Z0279 Encounter for issue of other medical certificate: Secondary | ICD-10-CM

## 2023-03-07 ENCOUNTER — Other Ambulatory Visit: Payer: Self-pay | Admitting: Family Medicine

## 2023-04-15 ENCOUNTER — Encounter: Payer: Self-pay | Admitting: Family Medicine

## 2023-04-15 NOTE — Telephone Encounter (Signed)
FMLA paperwork from [employer] was received on 04/15/2023 & placed in provider's mailbox. Please allow 7-10 business days to be completed. Thank you

## 2023-04-18 DIAGNOSIS — Z0279 Encounter for issue of other medical certificate: Secondary | ICD-10-CM

## 2023-04-18 NOTE — Telephone Encounter (Signed)
Will leave in the fax box for processing with HIM and submitting. Please let patient know when completed.

## 2023-04-24 NOTE — Telephone Encounter (Signed)
Received disability paperwork from [employer] and placed in Dr. Senaida Lange box.

## 2023-04-28 NOTE — Telephone Encounter (Signed)
I think this is a duplicate. See forms submitted 10/4 in media tab.  Any way to check and see if they were received or if there was something else they needed?

## 2023-05-02 ENCOUNTER — Other Ambulatory Visit: Payer: Self-pay | Admitting: Family Medicine

## 2023-05-02 DIAGNOSIS — I10 Essential (primary) hypertension: Secondary | ICD-10-CM

## 2023-05-02 DIAGNOSIS — F419 Anxiety disorder, unspecified: Secondary | ICD-10-CM

## 2023-05-02 NOTE — Telephone Encounter (Signed)
Appointment scheduled 10/28 Requested Prescriptions  Pending Prescriptions Disp Refills   sertraline (ZOLOFT) 100 MG tablet [Pharmacy Med Name: SERTRALINE 100MG  TABLETS] 90 tablet 0    Sig: TAKE 1 TABLET(100 MG) BY MOUTH DAILY     Psychiatry:  Antidepressants - SSRI - sertraline Passed - 05/02/2023  6:21 AM      Passed - AST in normal range and within 360 days    AST  Date Value Ref Range Status  02/17/2023 24 0 - 40 IU/L Final         Passed - ALT in normal range and within 360 days    ALT  Date Value Ref Range Status  02/17/2023 9 0 - 44 IU/L Final         Passed - Completed PHQ-2 or PHQ-9 in the last 360 days      Passed - Valid encounter within last 6 months    Recent Outpatient Visits           2 months ago Primary hypertension   [place] [facility] [place], Marzella Schlein, MD   3 months ago GAD (generalized anxiety disorder)   [facility] [name], Marzella Schlein, MD   4 months ago Memory loss   [facility] [place], Marzella Schlein, MD   5 months ago Syncope, unspecified syncope type   [place] [facility] [place], Marzella Schlein, MD   6 months ago Imbalance   [facility] [place], Marzella Schlein, MD       Future Appointments             In 1 week [name], Marzella Schlein, [facility]             metoprolol tartrate (LOPRESSOR) 25 MG tablet [Pharmacy Med Name: METOPROLOL TARTRATE 25MG  TABLETS] 180 tablet 0    Sig: TAKE 1 TABLET(25 MG) BY MOUTH TWICE DAILY     Cardiovascular:  Beta Blockers Passed - 05/02/2023  6:21 AM      Passed - Last BP in normal range    BP Readings from Last 1 Encounters:  02/17/23 136/76         Passed - Last Heart Rate in normal range    Pulse Readings from Last 1 Encounters:  02/17/23 74         Passed - Valid encounter within last 6 months    Recent Outpatient Visits           2 months ago  Primary hypertension   [place] [place], Marzella Schlein, MD   3 months ago GAD (generalized anxiety disorder)   [place] [facility] [place], Marzella Schlein, MD   4 months ago Memory loss   [facility] [place], Marzella Schlein, MD   5 months ago Syncope, unspecified syncope type   [facility] [place], Marzella Schlein, MD   6 months ago Imbalance   [place] [facility] [place], Marzella Schlein, MD       Future Appointments             In 1 week [name], Marzella Schlein, MD [facility], [facility]

## 2023-05-06 ENCOUNTER — Encounter: Payer: 59 | Admitting: Family Medicine

## 2023-05-12 ENCOUNTER — Ambulatory Visit (INDEPENDENT_AMBULATORY_CARE_PROVIDER_SITE_OTHER): Payer: 59 | Admitting: Family Medicine

## 2023-05-12 VITALS — BP 130/70 | HR 62 | Ht 69.0 in | Wt 181.9 lb

## 2023-05-12 DIAGNOSIS — Z23 Encounter for immunization: Secondary | ICD-10-CM | POA: Diagnosis not present

## 2023-05-12 DIAGNOSIS — E782 Mixed hyperlipidemia: Secondary | ICD-10-CM

## 2023-05-12 DIAGNOSIS — G20A1 Parkinson's disease without dyskinesia, without mention of fluctuations: Secondary | ICD-10-CM | POA: Insufficient documentation

## 2023-05-12 DIAGNOSIS — Z0000 Encounter for general adult medical examination without abnormal findings: Secondary | ICD-10-CM

## 2023-05-12 DIAGNOSIS — Z0001 Encounter for general adult medical examination with abnormal findings: Secondary | ICD-10-CM

## 2023-05-12 DIAGNOSIS — F321 Major depressive disorder, single episode, moderate: Secondary | ICD-10-CM

## 2023-05-12 DIAGNOSIS — F411 Generalized anxiety disorder: Secondary | ICD-10-CM

## 2023-05-12 DIAGNOSIS — I10 Essential (primary) hypertension: Secondary | ICD-10-CM | POA: Diagnosis not present

## 2023-05-12 NOTE — Progress Notes (Signed)
Complete physical exam  Patient: Roy Little   DOB: July 26, 1965   57 y.o. Male  MRN: [id number]  Subjective:    Chief Complaint  Patient presents with   Annual Exam    Last completed 04/08/22 Diet - Trying to watch sugar intake Exercise - 6 days a week for 40 minutes Feeling - Well Sleeping -Well Concerns - NOne    Roy Little is a 57 y.o. male who presents today for a complete physical exam.   Discussed the use of AI scribe software for clinical note transcription with the patient, who gave verbal consent to proceed.  History of Present Illness   The patient, with a history of Parkinson's disease, hypertension, and hyperlipidemia, presents for a routine follow-up and physical. He reports that his Parkinson's medication, carbidopa levodopa, has been doubled to two tablets three times a day. He also mentions occasional lightheadedness upon standing, which he attributes to his Parkinson's disease.  The patient's hypertension is managed with metoprolol 25mg  twice daily, lisinopril 20mg  once daily, and amlodipine 10mg  once daily. He expresses concern about his blood pressure being slightly elevated at the beginning of the visit, but it was rechecked and found to be within acceptable limits.  The patient's hyperlipidemia is managed with Lipitor 40mg  daily. He reports no issues with this medication.  The patient also discusses his mood, stating that he feels good and is enjoying life, despite his Parkinson's diagnosis. He is currently on Wellbutrin and Zoloft, which he feels are helping. He mentions a history of cancer, but does not elaborate on the type or treatment.  The patient is also raising a baby squirrel, which he brought up due to visible scratches on his arms. He assures the doctor that he is not self-harming.       Most recent fall risk assessment:    05/12/2023    2:10 PM  Fall Risk   Falls in the past year? 1  Number falls in past yr: 1  Injury with Fall? 1   Risk for fall due to : History of fall(s)  Follow up Falls evaluation completed     Most recent depression screenings:    05/12/2023    2:10 PM 02/17/2023    4:02 PM  PHQ 2/9 Scores  PHQ - 2 Score 1 0  PHQ- 9 Score  2        Patient Care Team: Erasmo Downer, MD as PCP - General (Family Medicine)   Outpatient Medications Prior to Visit  Medication Sig   amLODipine (NORVASC) 10 MG tablet TAKE 1 TABLET(10 MG) BY MOUTH DAILY   atorvastatin (LIPITOR) 40 MG tablet TAKE 1 TABLET(40 MG) BY MOUTH AT BEDTIME   buPROPion (WELLBUTRIN XL) 150 MG 24 hr tablet Take 1 tablet (150 mg total) by mouth daily.   carbidopa-levodopa (SINEMET IR) 25-100 MG tablet Take 2 tablets by mouth 3 (three) times daily.   finasteride (PROSCAR) 5 MG tablet TAKE 1 TABLET(5 MG) BY MOUTH DAILY   lisinopril (ZESTRIL) 20 MG tablet Take 1 tablet (20 mg total) by mouth daily.   metoprolol tartrate (LOPRESSOR) 25 MG tablet TAKE 1 TABLET(25 MG) BY MOUTH TWICE DAILY   sertraline (ZOLOFT) 100 MG tablet TAKE 1 TABLET(100 MG) BY MOUTH DAILY   No facility-administered medications prior to visit.    ROS      Objective:     BP 130/70 (BP Location: Right Arm, Patient Position: Sitting, Cuff Size: Large)   Pulse 62   Ht  5\' 9"  (1.753 m)   Wt 181 lb 14.4 oz (82.5 kg)   SpO2 98%   BMI 26.86 kg/m    Physical Exam Vitals reviewed.  Constitutional:      General: He is not in acute distress.    Appearance: Normal appearance. He is well-developed. He is not diaphoretic.  HENT:     Head: Normocephalic and atraumatic.     Right Ear: Tympanic membrane, ear canal and external ear normal.     Left Ear: Tympanic membrane, ear canal and external ear normal.     Nose: Nose normal.     Mouth/Throat:     Mouth: Mucous membranes are moist.     Pharynx: Oropharynx is clear. No oropharyngeal exudate.  Eyes:     General: No scleral icterus.    Conjunctiva/sclera: Conjunctivae normal.     Pupils: Pupils are equal,  round, and reactive to light.  Neck:     Thyroid: No thyromegaly.  Cardiovascular:     Rate and Rhythm: Normal rate and regular rhythm.     Heart sounds: Normal heart sounds. No murmur heard. Pulmonary:     Effort: Pulmonary effort is normal. No respiratory distress.     Breath sounds: Normal breath sounds. No wheezing or rales.  Abdominal:     General: There is no distension.     Palpations: Abdomen is soft.     Tenderness: There is no abdominal tenderness.  Musculoskeletal:        General: No deformity.     Cervical back: Neck supple.     Right lower leg: No edema.     Left lower leg: No edema.  Lymphadenopathy:     Cervical: No cervical adenopathy.  Skin:    General: Skin is warm and dry.     Findings: No rash.  Neurological:     Mental Status: He is alert and oriented to person, place, and time. Mental status is at baseline.     Gait: Gait normal.  Psychiatric:        Mood and Affect: Mood normal.        Behavior: Behavior normal.        Thought Content: Thought content normal.      No results found for any visits on 05/12/23.     Assessment & Plan:    Routine Health Maintenance and Physical Exam  Immunization History  Administered Date(s) Administered   Influenza, Seasonal, Injecte, Preservative Fre 05/12/2023   Influenza,inj,Quad PF,6+ Mos 03/26/2021, 04/08/2022   Moderna Covid-19 Vaccine Bivalent Booster 72yrs & up 06/21/2021   PFIZER(Purple Top)SARS-COV-2 Vaccination 09/09/2019, 09/30/2019   Tdap 06/30/2017   Zoster Recombinant(Shingrix) 03/26/2021, 07/03/2021    Health Maintenance  Topic Date Due   COVID-19 Vaccine (4 - 2023-24 season) 03/16/2023   Fecal DNA (Cologuard)  04/09/2024   DTaP/Tdap/Td (2 - Td or Tdap) 07/01/2027   INFLUENZA VACCINE  Completed   Hepatitis C Screening  Completed   HIV Screening  Completed   Zoster Vaccines- Shingrix  Completed   HPV VACCINES  Aged Out    Discussed health benefits of physical activity, and encouraged him  to engage in regular exercise appropriate for his age and condition.  Problem List Items Addressed This Visit       Cardiovascular and Mediastinum   Hypertension    Initial elevated blood pressure on arrival, but decreased to 130/70 on recheck. Patient reports occasional orthostatic symptoms. -Continue current antihypertensive regimen (Metoprolol 25mg  twice daily, Lisinopril 20mg  daily, Amlodipine 10mg   daily). -Recheck blood pressure in 6 months.        Nervous and Auditory   Parkinson disease (HCC)    Stable on increased dose of Carbidopa-Levodopa (2 tablets three times a day). No new symptoms reported. -Continue current medication regimen.        Other   Hyperlipidemia    Stable on Atorvastatin 40mg  daily. -Continue current medication regimen.      GAD (generalized anxiety disorder)    Patient reports good mood and quality of life on current regimen of Wellbutrin and Zoloft. -Continue current medication regimen.      Current moderate episode of major depressive disorder without prior episode [facility])    Patient reports good mood and quality of life on current regimen of Wellbutrin and Zoloft. -Continue current medication regimen.      Other Visit Diagnoses     Encounter for annual physical exam    -  Primary   Encounter for immunization       Relevant Orders   Flu vaccine trivalent PF, 6mos and older(Flulaval,Afluria,Fluarix,Fluzone) (Completed)           General Health Maintenance -Influenza vaccine administered today. -PSA to be checked with next labs. -Schedule 17-month follow-up appointment.       Return in about 6 months (around 11/10/2023) for chronic disease f/u.     Shirlee Latch, MD

## 2023-05-12 NOTE — Assessment & Plan Note (Signed)
Stable on increased dose of Carbidopa-Levodopa (2 tablets three times a day). No new symptoms reported. -Continue current medication regimen.

## 2023-05-12 NOTE — Assessment & Plan Note (Signed)
Patient reports good mood and quality of life on current regimen of Wellbutrin and Zoloft. -Continue current medication regimen.

## 2023-05-12 NOTE — Assessment & Plan Note (Signed)
Initial elevated blood pressure on arrival, but decreased to 130/70 on recheck. Patient reports occasional orthostatic symptoms. -Continue current antihypertensive regimen (Metoprolol 25mg  twice daily, Lisinopril 20mg  daily, Amlodipine 10mg  daily). -Recheck blood pressure in 6 months.

## 2023-05-12 NOTE — Assessment & Plan Note (Signed)
Stable on Atorvastatin 40mg  daily. -Continue current medication regimen.

## 2023-05-24 ENCOUNTER — Other Ambulatory Visit: Payer: Self-pay | Admitting: Family Medicine

## 2023-05-26 NOTE — Telephone Encounter (Signed)
Requested Prescriptions  Pending Prescriptions Disp Refills   lisinopril (ZESTRIL) 20 MG tablet [Pharmacy Med Name: LISINOPRIL 20MG  TABLETS] 90 tablet 1    Sig: TAKE 1 TABLET(20 MG) BY MOUTH DAILY     Cardiovascular:  ACE Inhibitors Passed - 05/24/2023  9:32 AM      Passed - Cr in normal range and within 180 days    Creatinine, Ser  Date Value Ref Range Status  02/17/2023 1.12 0.76 - 1.27 mg/dL Final         Passed - K in normal range and within 180 days    Potassium  Date Value Ref Range Status  02/17/2023 4.9 3.5 - 5.2 mmol/L Final         Passed - Patient is not pregnant      Passed - Last BP in normal range    BP Readings from Last 1 Encounters:  05/12/23 130/70         Passed - Valid encounter within last 6 months    Recent Outpatient Visits           2 weeks ago Encounter for annual physical exam   Roy Little, Roy Schlein, Roy Little   3 months ago Primary hypertension   Roy Little, Roy Schlein, Roy Little   4 months ago GAD (generalized anxiety disorder)   Roy Little, Roy Schlein, Roy Little   5 months ago Memory loss   Roy Little, Roy Schlein, Roy Little   5 months ago Syncope, unspecified syncope type   [facility] [name], Roy Schlein, Roy Little       Future Appointments             In 5 months [name], Roy Schlein, Roy Little [facility], [facility]

## 2023-06-01 ENCOUNTER — Other Ambulatory Visit: Payer: Self-pay | Admitting: Family Medicine

## 2023-06-02 NOTE — Telephone Encounter (Signed)
Requested medication (s) are due for refill today:yes  Requested medication (s) are on the active medication list:yes  Last refill:  12/13/22 #90 1 RF  Future visit scheduled: yes  Notes to clinic:  overdue lab work   Requested Prescriptions  Pending Prescriptions Disp Refills   finasteride (PROSCAR) 5 MG tablet [Pharmacy Med Name: FINASTERIDE 5MG  TABLETS] 90 tablet 1    Sig: TAKE 1 TABLET(5 MG) BY MOUTH DAILY     Urology: 5-alpha Reductase Inhibitors Failed - 06/01/2023  3:22 PM      Failed - PSA in normal range and within 360 days    Prostate Specific Ag, Serum  Date Value Ref Range Status  04/08/2022 0.3 0.0 - 4.0 ng/mL Final    Comment:    Roche ECLIA methodology. According to the American Urological Association, Serum PSA should decrease and remain at undetectable levels after radical prostatectomy. The AUA defines biochemical recurrence as an initial PSA value 0.2 ng/mL or greater followed by a subsequent confirmatory PSA value 0.2 ng/mL or greater. Values obtained with different assay methods or kits cannot be used interchangeably. Results cannot be interpreted as absolute evidence of the presence or absence of malignant disease.          Passed - Valid encounter within last 12 months    Recent Outpatient Visits           3 weeks ago Encounter for annual physical exam   [place] [facility] [place], Marzella Schlein, MD   3 months ago Primary hypertension   [place] [facility], Marzella Schlein, MD   4 months ago GAD (generalized anxiety disorder)   [place] [facility] [place], Marzella Schlein, MD   5 months ago Memory loss   [facility] [place], Marzella Schlein, MD   6 months ago Syncope, unspecified syncope type   [facility] [name], Marzella Schlein, MD       Future Appointments             In 5 months [name], Marzella Schlein, MD [facility], [facility]

## 2023-06-04 ENCOUNTER — Other Ambulatory Visit: Payer: Self-pay | Admitting: Family Medicine

## 2023-06-05 ENCOUNTER — Telehealth: Payer: Self-pay | Admitting: Family Medicine

## 2023-06-05 NOTE — Telephone Encounter (Signed)
Received long term disability forms from [employer]. Sending those back to for completion

## 2023-06-05 NOTE — Telephone Encounter (Signed)
Requested Prescriptions  Pending Prescriptions Disp Refills   atorvastatin (LIPITOR) 40 MG tablet [Pharmacy Med Name: ATORVASTATIN 40MG  TABLETS] 90 tablet 2    Sig: TAKE 1 TABLET(40 MG) BY MOUTH AT BEDTIME     Cardiovascular:  Antilipid - Statins Failed - 06/04/2023  9:22 PM      Failed - Lipid Panel in normal range within the last 12 months    Cholesterol, Total  Date Value Ref Range Status  02/17/2023 141 100 - 199 mg/dL Final   LDL Chol Calc (NIH)  Date Value Ref Range Status  02/17/2023 87 0 - 99 mg/dL Final   HDL  Date Value Ref Range Status  02/17/2023 28 (L) >39 mg/dL Final   Triglycerides  Date Value Ref Range Status  02/17/2023 148 0 - 149 mg/dL Final         Passed - Patient is not pregnant      Passed - Valid encounter within last 12 months    Recent Outpatient Visits           3 weeks ago Encounter for annual physical exam   [place] [facility] [place], Marzella Schlein, MD   3 months ago Primary hypertension   [place] [place], Marzella Schlein, MD   4 months ago GAD (generalized anxiety disorder)   [place] [facility] [place], Marzella Schlein, MD   5 months ago Memory loss   [facility] [place], Marzella Schlein, MD   6 months ago Syncope, unspecified syncope type   [facility] [name], Marzella Schlein, MD       Future Appointments             In 5 months [name], Marzella Schlein, MD [facility]

## 2023-06-17 NOTE — Telephone Encounter (Signed)
Completed. Will leave at front desk fax area

## 2023-06-17 NOTE — Telephone Encounter (Signed)
Forms have been faxed 06/17/23

## 2023-07-27 ENCOUNTER — Other Ambulatory Visit: Payer: Self-pay | Admitting: Family Medicine

## 2023-07-29 NOTE — Telephone Encounter (Signed)
 Requested Prescriptions  Pending Prescriptions Disp Refills   buPROPion  (WELLBUTRIN  XL) 150 MG 24 hr tablet [Pharmacy Med Name: BUPROPION  XL 150MG  TABLETS (24 H)] 90 tablet 1    Sig: TAKE 1 TABLET(150 MG) BY MOUTH DAILY     Psychiatry: Antidepressants - bupropion  Passed - 07/29/2023 11:05 AM      Passed - Cr in normal range and within 360 days    Creatinine, Ser  Date Value Ref Range Status  02/17/2023 1.12 0.76 - 1.27 mg/dL Final         Passed - AST in normal range and within 360 days    AST  Date Value Ref Range Status  02/17/2023 24 0 - 40 IU/L Final         Passed - ALT in normal range and within 360 days    ALT  Date Value Ref Range Status  02/17/2023 9 0 - 44 IU/L Final         Passed - Completed PHQ-2 or PHQ-9 in the last 360 days      Passed - Last BP in normal range    BP Readings from Last 1 Encounters:  05/12/23 130/70         Passed - Valid encounter within last 6 months    Recent Outpatient Visits           2 months ago Encounter for annual physical exam   [facility] [place], Jon HERO, MD   5 months ago Primary hypertension   [facility] [place], Jon HERO, MD   6 months ago GAD (generalized anxiety disorder)   [place] [facility] [place], Jon HERO, MD   7 months ago Memory loss   [facility] [place], Jon HERO, MD   7 months ago Syncope, unspecified syncope type   [facility] [name], Jon HERO, MD       Future Appointments             In 3 months [name], Jon HERO, MD [facility]

## 2023-07-31 ENCOUNTER — Other Ambulatory Visit: Payer: Self-pay | Admitting: Family Medicine

## 2023-07-31 DIAGNOSIS — I10 Essential (primary) hypertension: Secondary | ICD-10-CM

## 2023-07-31 DIAGNOSIS — F419 Anxiety disorder, unspecified: Secondary | ICD-10-CM

## 2023-07-31 NOTE — Telephone Encounter (Signed)
Requested by interface surescripts. Future visit in 3 months.  Requested Prescriptions  Pending Prescriptions Disp Refills   sertraline (ZOLOFT) 100 MG tablet [Pharmacy Med Name: SERTRALINE 100MG  TABLETS] 90 tablet 0    Sig: TAKE 1 TABLET(100 MG) BY MOUTH DAILY     Psychiatry:  Antidepressants - SSRI - sertraline Passed - 07/31/2023  2:48 PM      Passed - AST in normal range and within 360 days    AST  Date Value Ref Range Status  02/17/2023 24 0 - 40 IU/L Final         Passed - ALT in normal range and within 360 days    ALT  Date Value Ref Range Status  02/17/2023 9 0 - 44 IU/L Final         Passed - Completed PHQ-2 or PHQ-9 in the last 360 days      Passed - Valid encounter within last 6 months    Recent Outpatient Visits           2 months ago Encounter for annual physical exam   [place] [place], Marzella Schlein, MD   5 months ago Primary hypertension   [facility] [place], Marzella Schlein, MD   6 months ago GAD (generalized anxiety disorder)   [facility] [place], Marzella Schlein, MD   7 months ago Memory loss   [facility] [place], Marzella Schlein, MD   8 months ago Syncope, unspecified syncope type   [name], Marzella Schlein, MD       Future Appointments             In 3 months [name], Marzella Schlein, MD [facility]             metoprolol tartrate (LOPRESSOR) 25 MG tablet [Pharmacy Med Name: METOPROLOL TARTRATE 25MG  TABLETS] 180 tablet 0    Sig: TAKE 1 TABLET(25 MG) BY MOUTH TWICE DAILY     Cardiovascular:  Beta Blockers Passed - 07/31/2023  2:48 PM      Passed - Last BP in normal range    BP Readings from Last 1 Encounters:  05/12/23 130/70         Passed - Last Heart Rate in normal range    Pulse Readings from Last 1 Encounters:  05/12/23 62         Passed - Valid encounter within last 6  months    Recent Outpatient Visits           2 months ago Encounter for annual physical exam   [place] [place], Marzella Schlein, MD   5 months ago Primary hypertension   [facility] [place], Marzella Schlein, MD   6 months ago GAD (generalized anxiety disorder)   [facility] [place], Marzella Schlein, MD   7 months ago Memory loss   [facility] [place], Marzella Schlein, MD   8 months ago Syncope, unspecified syncope type   [name], Marzella Schlein, MD       Future Appointments             In 3 months [name], Marzella Schlein, MD [facility], [facility]

## 2023-08-03 ENCOUNTER — Other Ambulatory Visit: Payer: Self-pay | Admitting: Family Medicine

## 2023-08-03 DIAGNOSIS — I10 Essential (primary) hypertension: Secondary | ICD-10-CM

## 2023-08-04 NOTE — Telephone Encounter (Signed)
Requested Prescriptions  Pending Prescriptions Disp Refills   amLODipine (NORVASC) 10 MG tablet [Pharmacy Med Name: AMLODIPINE BESYLATE 10MG  TABLETS] 90 tablet 0    Sig: TAKE 1 TABLET(10 MG) BY MOUTH DAILY     Cardiovascular: Calcium Channel Blockers 2 Passed - 08/04/2023  2:26 PM      Passed - Last BP in normal range    BP Readings from Last 1 Encounters:  05/12/23 130/70         Passed - Last Heart Rate in normal range    Pulse Readings from Last 1 Encounters:  05/12/23 62         Passed - Valid encounter within last 6 months    Recent Outpatient Visits           2 months ago Encounter for annual physical exam   [place] [facility] [place], Marzella Schlein, MD   5 months ago Primary hypertension   [place] [facility] [place], Marzella Schlein, MD   6 months ago GAD (generalized anxiety disorder)   [place] [facility] [place], Marzella Schlein, MD   7 months ago Memory loss   [facility] [place], Marzella Schlein, MD   8 months ago Syncope, unspecified syncope type   [name], Marzella Schlein, MD       Future Appointments             In 3 months [name], Marzella Schlein, MD [facility]

## 2023-09-05 ENCOUNTER — Other Ambulatory Visit: Payer: Self-pay | Admitting: Family Medicine

## 2023-09-08 NOTE — Telephone Encounter (Signed)
 Requested Prescriptions  Pending Prescriptions Disp Refills   finasteride (PROSCAR) 5 MG tablet [Pharmacy Med Name: FINASTERIDE 5MG  TABLETS] 90 tablet 0    Sig: TAKE 1 TABLET(5 MG) BY MOUTH DAILY     Urology: 5-alpha Reductase Inhibitors Failed - 09/08/2023 12:30 PM      Failed - PSA in normal range and within 360 days    Prostate Specific Ag, Serum  Date Value Ref Range Status  04/08/2022 0.3 0.0 - 4.0 ng/mL Final    Comment:    Roche ECLIA methodology. According to the American Urological Association, Serum PSA should decrease and remain at undetectable levels after radical prostatectomy. The AUA defines biochemical recurrence as an initial PSA value 0.2 ng/mL or greater followed by a subsequent confirmatory PSA value 0.2 ng/mL or greater. Values obtained with different assay methods or kits cannot be used interchangeably. Results cannot be interpreted as absolute evidence of the presence or absence of malignant disease.          Passed - Valid encounter within last 12 months    Recent Outpatient Visits           3 months ago Encounter for annual physical exam   [place] [facility] [place], Marzella Schlein, MD   6 months ago Primary hypertension   [place] [facility] [place], Marzella Schlein, MD   7 months ago GAD (generalized anxiety disorder)    [facility] [place], Marzella Schlein, MD   9 months ago Memory loss   [facility] [place], Marzella Schlein, MD   9 months ago Syncope, unspecified syncope type   [name], Marzella Schlein, MD       Future Appointments             In 2 months [name], Marzella Schlein, MD [facility]

## 2023-10-29 ENCOUNTER — Other Ambulatory Visit: Payer: Self-pay | Admitting: Family Medicine

## 2023-10-29 DIAGNOSIS — F32A Depression, unspecified: Secondary | ICD-10-CM

## 2023-10-29 DIAGNOSIS — F419 Anxiety disorder, unspecified: Secondary | ICD-10-CM

## 2023-10-29 DIAGNOSIS — I10 Essential (primary) hypertension: Secondary | ICD-10-CM

## 2023-10-30 NOTE — Telephone Encounter (Signed)
 LOV 05/12/2023.   Has upcoming appt 11/10/2023.  Requested Prescriptions  Pending Prescriptions Disp Refills   sertraline (ZOLOFT) 100 MG tablet [Pharmacy Med Name: SERTRALINE 100MG  TABLETS] 90 tablet 0    Sig: TAKE 1 TABLET(100 MG) BY MOUTH DAILY     Psychiatry:  Antidepressants - SSRI - sertraline Failed - 10/30/2023  9:56 AM      Failed - Valid encounter within last 6 months    Recent Outpatient Visits   None     Future Appointments             In 1 week [name], Stan Eans, MD [facility]            Passed - AST in normal range and within 360 days    AST  Date Value Ref Range Status  02/17/2023 24 0 - 40 IU/L Final         Passed - ALT in normal range and within 360 days    ALT  Date Value Ref Range Status  02/17/2023 9 0 - 44 IU/L Final         Passed - Completed PHQ-2 or PHQ-9 in the last 360 days       metoprolol tartrate (LOPRESSOR) 25 MG tablet [Pharmacy Med Name: METOPROLOL TARTRATE 25MG  TABLETS] 180 tablet 0    Sig: TAKE 1 TABLET(25 MG) BY MOUTH TWICE DAILY     Cardiovascular:  Beta Blockers Failed - 10/30/2023  9:56 AM      Failed - Valid encounter within last 6 months    Recent Outpatient Visits   None     Future Appointments             In 1 week [name], Stan Eans, MD [facility]            Passed - Last BP in normal range    BP Readings from Last 1 Encounters:  05/12/23 130/70         Passed - Last Heart Rate in normal range    Pulse Readings from Last 1 Encounters:  05/12/23 62

## 2023-11-10 ENCOUNTER — Ambulatory Visit (INDEPENDENT_AMBULATORY_CARE_PROVIDER_SITE_OTHER): Payer: Self-pay | Admitting: Family Medicine

## 2023-11-10 VITALS — BP 130/65 | HR 63 | Ht 69.0 in | Wt 177.3 lb

## 2023-11-10 DIAGNOSIS — E782 Mixed hyperlipidemia: Secondary | ICD-10-CM

## 2023-11-10 DIAGNOSIS — F321 Major depressive disorder, single episode, moderate: Secondary | ICD-10-CM | POA: Diagnosis not present

## 2023-11-10 DIAGNOSIS — F411 Generalized anxiety disorder: Secondary | ICD-10-CM

## 2023-11-10 DIAGNOSIS — F419 Anxiety disorder, unspecified: Secondary | ICD-10-CM

## 2023-11-10 DIAGNOSIS — F32A Depression, unspecified: Secondary | ICD-10-CM

## 2023-11-10 DIAGNOSIS — G20A1 Parkinson's disease without dyskinesia, without mention of fluctuations: Secondary | ICD-10-CM

## 2023-11-10 DIAGNOSIS — I10 Essential (primary) hypertension: Secondary | ICD-10-CM

## 2023-11-10 MED ORDER — SERTRALINE HCL 50 MG PO TABS: ORAL_TABLET | ORAL | 0 refills | Status: DC

## 2023-11-10 NOTE — Assessment & Plan Note (Signed)
 Anxiety is well-managed, typically increasing in the late afternoon. Considering tapering off sertraline  as anxiety is in a good place. - Taper sertraline : 50 mg daily for 2 weeks, then 25 mg daily for 2 weeks. - Monitor for withdrawal symptoms such as dizziness or disequilibrium. If symptoms occur, consider tapering more slowly.

## 2023-11-10 NOTE — Assessment & Plan Note (Signed)
 Currently managed with atorvastatin  40 mg daily.

## 2023-11-10 NOTE — Assessment & Plan Note (Signed)
 Depression is well-managed. He has stopped bupropion  due to concerns about weight loss. Considering tapering off sertraline  as mood is stable. - Taper sertraline : 50 mg daily for 2 weeks, then 25 mg daily for 2 weeks. - Monitor for withdrawal symptoms such as dizziness or disequilibrium. If symptoms occur, consider tapering more slowly.

## 2023-11-10 NOTE — Assessment & Plan Note (Signed)
 Blood pressure is well-controlled with home readings of 130/65 mmHg. Continue current meds

## 2023-11-10 NOTE — Progress Notes (Signed)
 Established patient visit   Patient: Roy Little   DOB: [birth year]   58 y.o. Male  MRN: [id number] Visit Date: 11/10/2023  Today's healthcare provider: Aden Agreste, MD   Chief Complaint  Patient presents with   Medical Management of Chronic Issues   Hyperlipidemia   Hypertension    Pt reports checking at home today around 1 pm with reading of 130/65   Subjective    Hyperlipidemia  Hypertension   HPI     Hypertension    Additional comments: Pt reports checking at home today around 1 pm with reading of 130/65      Last edited by Pasty Bongo, CMA on 11/10/2023  2:32 PM.       Discussed the use of AI scribe software for clinical note transcription with the patient, who gave verbal consent to proceed.  History of Present Illness   A 58 year old patient with a history of hypertension, Parkinson's disease, hyperlipidemia, generalized anxiety disorder, and major depressive disorder presents for a chronic follow-up. The patient recently retired and reports a significant reduction in stress levels. However, he has experienced a significant weight loss over the past few months, dropping from 183 to 167 pounds. The patient attributes this weight loss to a loss of appetite and taste, possibly related to his Parkinson's disease or previous cancer treatment. In response to this weight loss, the patient stopped taking Wellbutrin  and adjusted his Sinemet dosage, which seems to have stabilized his weight. The patient also expresses a desire to taper off Zoloft , as he feels his mood and anxiety are in a good place.         Medications: Outpatient Medications Prior to Visit  Medication Sig   amLODipine  (NORVASC ) 10 MG tablet TAKE 1 TABLET(10 MG) BY MOUTH DAILY   atorvastatin  (LIPITOR) 40 MG tablet TAKE 1 TABLET(40 MG) BY MOUTH AT BEDTIME   carbidopa-levodopa (SINEMET IR) 25-100 MG tablet Take 2 tablets by mouth 3 (three) times daily.   finasteride  (PROSCAR ) 5 MG  tablet TAKE 1 TABLET(5 MG) BY MOUTH DAILY   lisinopril  (ZESTRIL ) 20 MG tablet TAKE 1 TABLET(20 MG) BY MOUTH DAILY   metoprolol  tartrate (LOPRESSOR ) 25 MG tablet TAKE 1 TABLET(25 MG) BY MOUTH TWICE DAILY   [DISCONTINUED] sertraline  (ZOLOFT ) 100 MG tablet TAKE 1 TABLET(100 MG) BY MOUTH DAILY   [DISCONTINUED] buPROPion  (WELLBUTRIN  XL) 150 MG 24 hr tablet TAKE 1 TABLET(150 MG) BY MOUTH DAILY (Patient not taking: Reported on 11/10/2023)   No facility-administered medications prior to visit.    Review of Systems     Objective    BP 130/65 Comment: home reading  Pulse 63   Ht 5\' 9"  (1.753 m)   Wt 177 lb 4.8 oz (80.4 kg)   SpO2 100%   BMI 26.18 kg/m    Physical Exam Vitals reviewed.  Constitutional:      General: He is not in acute distress.    Appearance: Normal appearance. He is not diaphoretic.  HENT:     Head: Normocephalic and atraumatic.  Eyes:     General: No scleral icterus.    Conjunctiva/sclera: Conjunctivae normal.  Cardiovascular:     Rate and Rhythm: Normal rate and regular rhythm.     Heart sounds: Normal heart sounds. No murmur heard. Pulmonary:     Effort: Pulmonary effort is normal. No respiratory distress.     Breath sounds: Normal breath sounds. No wheezing or rhonchi.  Musculoskeletal:     Cervical back: Neck supple.  Right lower leg: No edema.     Left lower leg: No edema.  Lymphadenopathy:     Cervical: No cervical adenopathy.  Skin:    General: Skin is warm and dry.     Findings: No rash.  Neurological:     Mental Status: He is alert and oriented to person, place, and time. Mental status is at baseline.  Psychiatric:        Mood and Affect: Mood normal.        Behavior: Behavior normal.      No results found for any visits on 11/10/23.  Assessment & Plan     Problem List Items Addressed This Visit       Cardiovascular and Mediastinum   Hypertension - Primary   Blood pressure is well-controlled with home readings of 130/65  mmHg. Continue current meds      Relevant Orders   Basic Metabolic Panel (BMET)     Nervous and Auditory   Parkinson disease (HCC)   Managed by neurology. He has adjusted Sinemet dosing to spread out doses, which has helped with nausea and weight loss. Weight has stabilized after stopping bupropion  and adjusting Sinemet dosing.        Other   Hyperlipidemia   Currently managed with atorvastatin  40 mg daily.      GAD (generalized anxiety disorder)   Anxiety is well-managed, typically increasing in the late afternoon. Considering tapering off sertraline  as anxiety is in a good place. - Taper sertraline : 50 mg daily for 2 weeks, then 25 mg daily for 2 weeks. - Monitor for withdrawal symptoms such as dizziness or disequilibrium. If symptoms occur, consider tapering more slowly.      Relevant Medications   sertraline  (ZOLOFT ) 50 MG tablet   Current moderate episode of major depressive disorder without prior episode (HCC)   Depression is well-managed. He has stopped bupropion  due to concerns about weight loss. Considering tapering off sertraline  as mood is stable. - Taper sertraline : 50 mg daily for 2 weeks, then 25 mg daily for 2 weeks. - Monitor for withdrawal symptoms such as dizziness or disequilibrium. If symptoms occur, consider tapering more slowly.      Relevant Medications   sertraline  (ZOLOFT ) 50 MG tablet   Other Visit Diagnoses       Anxiety and depression       Relevant Medications   sertraline  (ZOLOFT ) 50 MG tablet           General Health Maintenance Routine health maintenance is up to date. - Check kidney function and electrolytes today. - Plan for a full panel with cholesterol at the next physical in six months.        Return in about 6 months (around 05/11/2024) for CPE.       Aden Agreste, MD  [facility] [phone] (phone) [phone] (fax)  [facility]

## 2023-11-10 NOTE — Assessment & Plan Note (Signed)
 Managed by neurology. He has adjusted Sinemet dosing to spread out doses, which has helped with nausea and weight loss. Weight has stabilized after stopping bupropion  and adjusting Sinemet dosing.

## 2023-11-11 ENCOUNTER — Encounter: Payer: Self-pay | Admitting: Family Medicine

## 2023-11-11 LAB — BASIC METABOLIC PANEL WITH GFR
BUN/Creatinine Ratio: 19 (ref 9–20)
BUN: 17 mg/dL (ref 6–24)
CO2: 23 mmol/L (ref 20–29)
Calcium: 9.3 mg/dL (ref 8.7–10.2)
Chloride: 103 mmol/L (ref 96–106)
Creatinine, Ser: 0.88 mg/dL (ref 0.76–1.27)
Glucose: 111 mg/dL — ABNORMAL HIGH (ref 70–99)
Potassium: 4.3 mmol/L (ref 3.5–5.2)
Sodium: 141 mmol/L (ref 134–144)
eGFR: 99 mL/min/{1.73_m2} (ref >59)

## 2023-11-14 ENCOUNTER — Other Ambulatory Visit: Payer: Self-pay | Admitting: Family Medicine

## 2023-11-24 ENCOUNTER — Other Ambulatory Visit: Payer: Self-pay | Admitting: Family Medicine

## 2023-11-24 DIAGNOSIS — I10 Essential (primary) hypertension: Secondary | ICD-10-CM

## 2023-11-24 NOTE — Telephone Encounter (Unsigned)
 Copied from CRM [id number]. Topic: Clinical - Medication Question >> Nov 24, 2023  9:54 AM Rosamond Comes wrote: Reason for CRM: patient wife Bridgette Campus calling asking for medication refill update for amLODipine  (NORVASC ) 10 MG tablet Bridgette Campus spoke with pharmacy this morning, and pharmacy will send another request in [place] phone [phone]

## 2023-12-16 ENCOUNTER — Other Ambulatory Visit: Payer: Self-pay | Admitting: Family Medicine

## 2023-12-16 DIAGNOSIS — F419 Anxiety disorder, unspecified: Secondary | ICD-10-CM

## 2024-01-27 ENCOUNTER — Other Ambulatory Visit: Payer: Self-pay | Admitting: Family Medicine

## 2024-01-27 DIAGNOSIS — F419 Anxiety disorder, unspecified: Secondary | ICD-10-CM

## 2024-01-27 DIAGNOSIS — I10 Essential (primary) hypertension: Secondary | ICD-10-CM

## 2024-01-28 ENCOUNTER — Other Ambulatory Visit: Payer: Self-pay | Admitting: Family Medicine

## 2024-02-21 ENCOUNTER — Other Ambulatory Visit: Payer: Self-pay | Admitting: Physician Assistant

## 2024-02-21 DIAGNOSIS — I10 Essential (primary) hypertension: Secondary | ICD-10-CM

## 2024-03-08 ENCOUNTER — Other Ambulatory Visit: Payer: Self-pay

## 2024-03-08 ENCOUNTER — Telehealth: Payer: Self-pay | Admitting: Family Medicine

## 2024-03-08 MED ORDER — FINASTERIDE 5 MG PO TABS: ORAL_TABLET | ORAL | 0 refills | Status: DC

## 2024-03-08 NOTE — Telephone Encounter (Signed)
[facility] faxed refill request for the following medications:  finasteride (PROSCAR) 5 MG tablet   Please advise.  

## 2024-03-16 ENCOUNTER — Other Ambulatory Visit: Payer: Self-pay | Admitting: Family Medicine

## 2024-04-26 ENCOUNTER — Other Ambulatory Visit: Payer: Self-pay | Admitting: Family Medicine

## 2024-04-26 DIAGNOSIS — I10 Essential (primary) hypertension: Secondary | ICD-10-CM

## 2024-05-13 ENCOUNTER — Ambulatory Visit: Admitting: Family Medicine

## 2024-05-13 ENCOUNTER — Other Ambulatory Visit: Payer: Self-pay | Admitting: Family Medicine

## 2024-05-13 ENCOUNTER — Encounter: Payer: Self-pay | Admitting: Family Medicine

## 2024-05-13 VITALS — BP 123/71 | HR 74 | Resp 16 | Ht 69.0 in | Wt 166.6 lb

## 2024-05-13 DIAGNOSIS — F325 Major depressive disorder, single episode, in full remission: Secondary | ICD-10-CM | POA: Diagnosis not present

## 2024-05-13 DIAGNOSIS — Z23 Encounter for immunization: Secondary | ICD-10-CM

## 2024-05-13 DIAGNOSIS — E782 Mixed hyperlipidemia: Secondary | ICD-10-CM

## 2024-05-13 DIAGNOSIS — F411 Generalized anxiety disorder: Secondary | ICD-10-CM

## 2024-05-13 DIAGNOSIS — I10 Essential (primary) hypertension: Secondary | ICD-10-CM | POA: Diagnosis not present

## 2024-05-13 DIAGNOSIS — G20A1 Parkinson's disease without dyskinesia, without mention of fluctuations: Secondary | ICD-10-CM

## 2024-05-13 DIAGNOSIS — R739 Hyperglycemia, unspecified: Secondary | ICD-10-CM

## 2024-05-13 MED ORDER — METOPROLOL TARTRATE 25 MG PO TABS: ORAL_TABLET | ORAL | 1 refills | Status: AC

## 2024-05-13 MED ORDER — FINASTERIDE 5 MG PO TABS: ORAL_TABLET | ORAL | 1 refills | Status: AC

## 2024-05-13 MED ORDER — ATORVASTATIN CALCIUM 40 MG PO TABS: ORAL_TABLET | ORAL | 1 refills | Status: DC

## 2024-05-13 MED ORDER — AMLODIPINE BESYLATE 10 MG PO TABS: ORAL_TABLET | ORAL | 1 refills | Status: AC

## 2024-05-13 NOTE — Progress Notes (Signed)
 Established patient visit   Patient: Roy Little   DOB: 13-Apr-1966   58 y.o. Male  MRN: [id number] Visit Date: 05/13/2024  Today's healthcare provider: Jon Eva, MD   Chief Complaint  Patient presents with   Follow-up    6 Mth f/u with Labs ( no other concerns)   Subjective    HPI HPI     Follow-up    Additional comments: 6 Mth f/u with Labs ( no other concerns)      Last edited by Marylen Odella CROME, CMA on 05/13/2024  2:20 PM.       Discussed the use of AI scribe software for clinical note transcription with the patient, who gave verbal consent to proceed.  History of Present Illness   Roy Little is a 58 year old male with hypertension, Parkinson's disease, moderate depression, generalized anxiety disorder, and hyperlipidemia who presents for chronic follow-up.  His current medications include amlodipine  10 mg daily, atorvastatin  40 mg daily, carvedopa, levodopa, finasteride , lisinopril  20 mg daily, and metoprolol  25 mg BID. He has discontinued Zoloft  and reports stable mood and anxiety.  Blood pressure fluctuates, especially postprandially, with home readings generally around 120-130/71 mmHg.  He has not dreamed for months and describes his sleep as 'weird'. Despite a poor appetite, he maintains his weight at 165 lbs by consuming one Ensure and one protein drink daily, totaling about 60 grams of protein. He is concerned about further weight loss if he drops to 150 lbs.  He engages in physical activity, working out twice a day, and monitors his weight weekly.           05/13/2024    3:21 PM 11/10/2023    2:31 PM 05/12/2023    2:10 PM 02/17/2023    4:02 PM 01/14/2023    3:32 PM  Depression screen PHQ 2/9  Decreased Interest 0 0 0 0 0  Down, Depressed, Hopeless 0 0 1 0 1  PHQ - 2 Score 0 0 1 0 1  Altered sleeping 0 0  0 1  Tired, decreased energy 0 1  1 1   Change in appetite  2  0 0  Feeling bad or failure about yourself  3 0  0 0   Trouble concentrating 0 2  1 1   Moving slowly or fidgety/restless 0 0  0 1  Suicidal thoughts 0 0  0 0  PHQ-9 Score 3 5  2 5   Difficult doing work/chores Not difficult at all Not difficult at all  Somewhat difficult Not difficult at all       05/13/2024    3:22 PM 11/10/2023    2:31 PM 05/12/2023    2:10 PM 10/06/2020   11:02 AM  GAD 7 : Generalized Anxiety Score  Nervous, Anxious, on Edge 0 0 0 0  Control/stop worrying 0 0 0 1  Worry too much - different things 0 0 1 1  Trouble relaxing 0 0 0 0  Restless 0 0 0 0  Easily annoyed or irritable 0 0 0 0  Afraid - awful might happen 0 0 1 0  Total GAD 7 Score 0 0 2 2  Anxiety Difficulty Not difficult at all Not difficult at all Not difficult at all Not difficult at all      Medications: Outpatient Medications Prior to Visit  Medication Sig   carbidopa-levodopa (SINEMET IR) 25-100 MG tablet Take 2 tablets by mouth 3 (three) times daily.   lisinopril  (ZESTRIL )  20 MG tablet TAKE 1 TABLET(20 MG) BY MOUTH DAILY   [DISCONTINUED] amLODipine  (NORVASC ) 10 MG tablet TAKE 1 TABLET(10 MG) BY MOUTH DAILY   [DISCONTINUED] atorvastatin  (LIPITOR) 40 MG tablet TAKE 1 TABLET(40 MG) BY MOUTH AT BEDTIME   [DISCONTINUED] finasteride  (PROSCAR ) 5 MG tablet TAKE 1 TABLET(5 MG) BY MOUTH DAILY   [DISCONTINUED] metoprolol  tartrate (LOPRESSOR ) 25 MG tablet TAKE 1 TABLET(25 MG) BY MOUTH TWICE DAILY   [DISCONTINUED] sertraline  (ZOLOFT ) 50 MG tablet Take 1 tablet (50 mg total) by mouth daily for 14 days, THEN 0.5 tablets (25 mg total) daily for 14 days.   No facility-administered medications prior to visit.    Review of Systems     Objective    BP 123/71 Comment: home reading  Pulse 74   Resp 16   Ht 5' 9 (1.753 m)   Wt 166 lb 9.6 oz (75.6 kg)   SpO2 100%   BMI 24.60 kg/m    Physical Exam Vitals reviewed.  Constitutional:      General: He is not in acute distress.    Appearance: Normal appearance. He is not diaphoretic.  HENT:     Head:  Normocephalic and atraumatic.  Eyes:     General: No scleral icterus.    Conjunctiva/sclera: Conjunctivae normal.  Cardiovascular:     Rate and Rhythm: Normal rate and regular rhythm.     Heart sounds: Normal heart sounds. No murmur heard. Pulmonary:     Effort: Pulmonary effort is normal. No respiratory distress.     Breath sounds: Normal breath sounds. No wheezing or rhonchi.  Musculoskeletal:     Cervical back: Neck supple.     Right lower leg: No edema.     Left lower leg: No edema.  Lymphadenopathy:     Cervical: No cervical adenopathy.  Skin:    General: Skin is warm and dry.     Findings: No rash.  Neurological:     Mental Status: He is alert and oriented to person, place, and time. Mental status is at baseline.  Psychiatric:        Mood and Affect: Mood normal.        Behavior: Behavior normal.      No results found for any visits on 05/13/24.  Assessment & Plan     Problem List Items Addressed This Visit       Cardiovascular and Mediastinum   Hypertension   Blood pressure is well-controlled at home with readings around 120-130/71 mmHg. Office readings elevated likely due to autonomic dysfunction from Parkinson's disease. - Continue current antihypertensive regimen including amlodipine , lisinopril , and metoprolol . - Monitor blood pressure regularly at home.      Relevant Medications   amLODipine  (NORVASC ) 10 MG tablet   atorvastatin  (LIPITOR) 40 MG tablet   metoprolol  tartrate (LOPRESSOR ) 25 MG tablet   Other Relevant Orders   Comprehensive metabolic panel with GFR     Nervous and Auditory   Parkinson disease (HCC)   Managed by neurology with autonomic dysfunction affecting blood pressure and appetite. - Continue current Parkinson's medications including carvedopa and levodopa. - Follow up with neurology as needed.        Other   Hyperlipidemia   Due for annual lipid panel. Last checked a year ago. - Order lipid panel to assess cholesterol levels.       Relevant Medications   amLODipine  (NORVASC ) 10 MG tablet   atorvastatin  (LIPITOR) 40 MG tablet   metoprolol  tartrate (LOPRESSOR ) 25 MG tablet  Other Relevant Orders   Comprehensive metabolic panel with GFR   Lipid panel   GAD (generalized anxiety disorder)   Well controlled No longer on Zoloft       MDD (major depressive disorder) - Primary   Depression is in remission. Successfully weaned off Zoloft  with no recurrence of depressive symptoms.      Other Visit Diagnoses       Encounter for immunization       Relevant Orders   Flu vaccine trivalent PF, 6mos and older(Flulaval,Afluria,Fluarix,Fluzone) (Completed)   Pneumococcal conjugate vaccine 20-valent (Completed)     Hyperglycemia       Relevant Orders   Hemoglobin A1c           Hyperglycemia (monitoring) No diabetes or prediabetes. A1c has been normal, but glucose levels have been slightly elevated in the past. - Order A1c to monitor blood sugar levels.      Return in about 6 months (around 11/11/2024) for CPE.       Jon Eva, MD  [facility] [phone] (phone) [phone] (fax)  [facility]

## 2024-05-13 NOTE — Assessment & Plan Note (Signed)
 Depression is in remission. Successfully weaned off Zoloft  with no recurrence of depressive symptoms.

## 2024-05-13 NOTE — Assessment & Plan Note (Signed)
 Blood pressure is well-controlled at home with readings around 120-130/71 mmHg. Office readings elevated likely due to autonomic dysfunction from Parkinson's disease. - Continue current antihypertensive regimen including amlodipine , lisinopril , and metoprolol . - Monitor blood pressure regularly at home.

## 2024-05-13 NOTE — Assessment & Plan Note (Signed)
 Managed by neurology with autonomic dysfunction affecting blood pressure and appetite. - Continue current Parkinson's medications including carvedopa and levodopa. - Follow up with neurology as needed.

## 2024-05-13 NOTE — Assessment & Plan Note (Signed)
 Well controlled No longer on Zoloft 

## 2024-05-13 NOTE — Assessment & Plan Note (Signed)
 Due for annual lipid panel. Last checked a year ago. - Order lipid panel to assess cholesterol levels.

## 2024-05-14 LAB — COMPREHENSIVE METABOLIC PANEL WITH GFR
ALT: 15 IU/L (ref 0–44)
AST: 24 IU/L (ref 0–40)
Albumin: 4.5 g/dL (ref 3.8–4.9)
Alkaline Phosphatase: 126 IU/L — ABNORMAL HIGH (ref 47–123)
BUN/Creatinine Ratio: 19 (ref 10–24)
BUN: 21 mg/dL (ref 8–27)
Bilirubin Total: 0.4 mg/dL (ref 0.0–1.2)
CO2: 23 mmol/L (ref 20–29)
Calcium: 9.2 mg/dL (ref 8.6–10.2)
Chloride: 102 mmol/L (ref 96–106)
Creatinine, Ser: 1.09 mg/dL (ref 0.76–1.27)
Globulin, Total: 1.9 g/dL (ref 1.5–4.5)
Glucose: 100 mg/dL — ABNORMAL HIGH (ref 70–99)
Potassium: 4.4 mmol/L (ref 3.5–5.2)
Sodium: 141 mmol/L (ref 134–144)
Total Protein: 6.4 g/dL (ref 6.0–8.5)
eGFR: 78 mL/min/1.73 (ref >59)

## 2024-05-14 LAB — LIPID PANEL
Chol/HDL Ratio: 3.2 ratio (ref 0.0–5.0)
Cholesterol, Total: 132 mg/dL (ref 100–199)
HDL: 41 mg/dL (ref >39)
LDL Chol Calc (NIH): 70 mg/dL (ref 0–99)
Triglycerides: 117 mg/dL (ref 0–149)
VLDL Cholesterol Cal: 21 mg/dL (ref 5–40)

## 2024-05-14 LAB — HEMOGLOBIN A1C
Est. average glucose Bld gHb Est-mCnc: 100 mg/dL
Hgb A1c MFr Bld: 5.1 % (ref 4.8–5.6)

## 2024-05-17 ENCOUNTER — Ambulatory Visit: Payer: Self-pay | Admitting: Family Medicine

## 2024-06-17 ENCOUNTER — Other Ambulatory Visit: Payer: Self-pay | Admitting: Family Medicine

## 2024-07-21 ENCOUNTER — Encounter: Payer: Self-pay | Admitting: Family Medicine

## 2024-07-22 NOTE — Telephone Encounter (Signed)
 Recommend appointment

## 2024-11-11 ENCOUNTER — Ambulatory Visit: Admitting: Family Medicine
